# Patient Record
Sex: Male | Born: 1950 | Race: White | Hispanic: No | Marital: Married | Smoking: Never smoker
Health system: Southern US, Community
[De-identification: ages and names within clinical notes are randomized; demographics above are authoritative.]

## PROBLEM LIST (undated history)

## (undated) DIAGNOSIS — I10 Essential (primary) hypertension: Secondary | ICD-10-CM

## (undated) DIAGNOSIS — I639 Cerebral infarction, unspecified: Secondary | ICD-10-CM

## (undated) DIAGNOSIS — E109 Type 1 diabetes mellitus without complications: Secondary | ICD-10-CM

## (undated) DIAGNOSIS — E785 Hyperlipidemia, unspecified: Secondary | ICD-10-CM

## (undated) DIAGNOSIS — G7000 Myasthenia gravis without (acute) exacerbation: Secondary | ICD-10-CM

## (undated) HISTORY — DX: Essential (primary) hypertension: I10

## (undated) HISTORY — DX: Myasthenia gravis without (acute) exacerbation: G70.00

## (undated) HISTORY — DX: Hyperlipidemia, unspecified: E78.5

## (undated) HISTORY — PX: BACK SURGERY: SHX140

## (undated) HISTORY — DX: Type 1 diabetes mellitus without complications: E10.9

---

## 2005-02-17 ENCOUNTER — Ambulatory Visit: Payer: Self-pay | Admitting: Cardiology

## 2016-07-06 LAB — LIPID PANEL
CHOLESTEROL: 146 (ref 0–200)
HDL: 46 (ref 35–70)
LDL Cholesterol: 82
TRIGLYCERIDES: 88 (ref 40–160)

## 2016-07-06 LAB — HEMOGLOBIN A1C: HEMOGLOBIN A1C: 6.3

## 2016-08-18 ENCOUNTER — Encounter: Payer: Self-pay | Admitting: "Endocrinology

## 2016-08-18 ENCOUNTER — Ambulatory Visit (INDEPENDENT_AMBULATORY_CARE_PROVIDER_SITE_OTHER): Payer: Managed Care, Other (non HMO) | Admitting: "Endocrinology

## 2016-08-18 VITALS — BP 135/69 | HR 77 | Ht 72.0 in | Wt 206.0 lb

## 2016-08-18 DIAGNOSIS — E108 Type 1 diabetes mellitus with unspecified complications: Secondary | ICD-10-CM | POA: Diagnosis not present

## 2016-08-18 DIAGNOSIS — E782 Mixed hyperlipidemia: Secondary | ICD-10-CM | POA: Diagnosis not present

## 2016-08-18 DIAGNOSIS — E119 Type 2 diabetes mellitus without complications: Secondary | ICD-10-CM | POA: Insufficient documentation

## 2016-08-18 DIAGNOSIS — IMO0002 Reserved for concepts with insufficient information to code with codable children: Secondary | ICD-10-CM

## 2016-08-18 DIAGNOSIS — I10 Essential (primary) hypertension: Secondary | ICD-10-CM

## 2016-08-18 DIAGNOSIS — E1065 Type 1 diabetes mellitus with hyperglycemia: Secondary | ICD-10-CM | POA: Diagnosis not present

## 2016-08-18 MED ORDER — INSULIN ASPART 100 UNIT/ML FLEXPEN: 8.0000 [IU] | PEN_INJECTOR | Freq: Three times a day (TID) | SUBCUTANEOUS | 2 refills | Status: DC

## 2016-08-18 MED ORDER — INSULIN DEGLUDEC 100 UNIT/ML ~~LOC~~ SOPN: 25.0000 [IU] | PEN_INJECTOR | Freq: Every day | SUBCUTANEOUS | 2 refills | Status: DC

## 2016-08-18 MED ORDER — INSULIN PEN NEEDLE 31G X 8 MM MISC: 1.0000 | 3 refills | Status: AC

## 2016-08-18 NOTE — Progress Notes (Signed)
Subjective:    Patient ID: William Beck, male    DOB: 03-Feb-1951. Patient is being seen in consultation for management of diabetes requested by  Deloria Lair., MD  Past Medical History:  Diagnosis Date  . Diabetes mellitus type I ([place])   . Hyperlipidemia   . Hypertension    Past Surgical History:  Procedure Laterality Date  . BACK SURGERY     Social History   Social History  . Marital status: Widowed    Spouse name: N/A  . Number of children: N/A  . Years of education: N/A   Social History Main Topics  . Smoking status: Never Smoker  . Smokeless tobacco: Never Used  . Alcohol use No  . Drug use: No  . Sexual activity: Not Asked   Other Topics Concern  . None   Social History Narrative  . None   Outpatient Encounter Prescriptions as of 08/18/2016  Medication Sig  . aspirin EC 81 MG tablet Take 81 mg by mouth daily.  [name] lisinopril-hydrochlorothiazide (PRINZIDE,ZESTORETIC) 20-25 MG tablet Take 1 tablet by mouth daily.  . metoprolol succinate (TOPROL-XL) 25 MG 24 hr tablet Take 25 mg by mouth daily.  . simvastatin (ZOCOR) 40 MG tablet Take 40 mg by mouth daily.  . [DISCONTINUED] insulin NPH Human (HUMULIN N,NOVOLIN N) 100 UNIT/ML injection Inject into the skin. 24 units qam & 10 units qhs  . [DISCONTINUED] insulin regular (NOVOLIN R,HUMULIN R) 100 units/mL injection Inject into the skin 3 (three) times daily before meals. 17 units qam & 17 units qhs  . insulin aspart (NOVOLOG FLEXPEN) 100 UNIT/ML FlexPen Inject 8-14 Units into the skin 3 (three) times daily with meals.  . insulin degludec (TRESIBA FLEXTOUCH) 100 UNIT/ML SOPN FlexTouch Pen Inject 0.25 mLs (25 Units total) into the skin daily at 10 pm.  . Insulin Pen Needle (B-D ULTRAFINE III SHORT PEN) 31G X 8 MM MISC 1 each by Does not apply route as directed.   No facility-administered encounter medications on file as of 08/18/2016.    ALLERGIES: No Known Allergies VACCINATION STATUS:  There is no  immunization history on file for this patient.  Diabetes  He presents for his initial diabetic visit. He has type 1 diabetes mellitus. Onset time: He was diagnosed at approximate age of 5 years. His disease course has been fluctuating. Hypoglycemia symptoms include confusion, headaches, hunger and sweats. Pertinent negatives for hypoglycemia include no pallor or seizures. There are no diabetic associated symptoms. Pertinent negatives for diabetes include no chest pain, no fatigue, no polydipsia, no polyphagia, no polyuria and no weakness. There are no hypoglycemic complications. Symptoms are worsening. There are no diabetic complications. Risk factors for coronary artery disease include diabetes mellitus, dyslipidemia, hypertension, male sex and sedentary lifestyle. Current diabetic treatment includes insulin injections (He is currently on Humulin and 24 units a.m. and 10 units p.m., Humulin R 17 units a.m. and 17 units p.m.). He is compliant with treatment most of the time. His weight is increasing steadily. He is following a generally unhealthy diet. When asked about meal planning, he reported none. He has not had a previous visit with a dietitian. He participates in exercise intermittently. His home blood glucose trend is fluctuating dramatically. (He brought her log book showing fluctuating blood glucose readings from 50s to 200s in the same day. His most recent A1c 6.3% from March 2018.) An ACE inhibitor/angiotensin II receptor blocker is being taken. He sees a podiatrist.Eye exam is current.  Hyperlipidemia  This is a chronic problem. The current episode started more than 1 year ago. The problem is controlled. Exacerbating diseases include diabetes. Pertinent negatives include no chest pain, myalgias or shortness of breath. Current antihyperlipidemic treatment includes statins. Risk factors for coronary artery disease include diabetes mellitus, dyslipidemia and hypertension.  Hypertension  This is a  chronic problem. The current episode started more than 1 year ago. The problem is controlled. Associated symptoms include headaches and sweats. Pertinent negatives include no chest pain, neck pain, palpitations or shortness of breath. Risk factors for coronary artery disease include dyslipidemia, diabetes mellitus and male gender. Past treatments include ACE inhibitors.       Review of Systems  Constitutional: Negative for chills, fatigue, fever and unexpected weight change.  HENT: Negative for dental problem, mouth sores and trouble swallowing.   Eyes: Negative for visual disturbance.  Respiratory: Negative for cough, choking, chest tightness, shortness of breath and wheezing.   Cardiovascular: Negative for chest pain, palpitations and leg swelling.  Gastrointestinal: Negative for abdominal distention, abdominal pain, constipation, diarrhea, nausea and vomiting.  Endocrine: Negative for polydipsia, polyphagia and polyuria.  Genitourinary: Negative for dysuria, flank pain, hematuria and urgency.  Musculoskeletal: Negative for back pain, gait problem, myalgias and neck pain.  Skin: Negative for pallor, rash and wound.  Neurological: Positive for headaches. Negative for seizures, syncope, weakness and numbness.  Psychiatric/Behavioral: Positive for confusion. Negative for dysphoric mood.    Objective:    BP 135/69   Pulse 77   Ht 6' (1.829 m)   Wt 206 lb (93.4 kg)   BMI 27.94 kg/m   Wt Readings from Last 3 Encounters:  08/18/16 206 lb (93.4 kg)    Physical Exam  Constitutional: He is oriented to person, place, and time. He appears well-developed and well-nourished. He is cooperative. No distress.  HENT:  Head: Normocephalic and atraumatic.  Eyes: EOM are normal.  Neck: Normal range of motion. Neck supple. No tracheal deviation present. No thyromegaly present.  Cardiovascular: Normal rate, S1 normal, S2 normal and normal heart sounds.  Exam reveals no gallop.   No murmur  heard. Pulses:      Dorsalis pedis pulses are 1+ on the right side, and 1+ on the left side.       Posterior tibial pulses are 1+ on the right side, and 1+ on the left side.  Pulmonary/Chest: Breath sounds normal. No respiratory distress. He has no wheezes.  Abdominal: Soft. Bowel sounds are normal. He exhibits no distension. There is no tenderness. There is no guarding and no CVA tenderness.  Musculoskeletal: He exhibits no edema.       Right shoulder: He exhibits no swelling and no deformity.  Neurological: He is alert and oriented to person, place, and time. He has normal strength and normal reflexes. No cranial nerve deficit or sensory deficit. Gait normal.  Skin: Skin is warm and dry. No rash noted. No cyanosis. Nails show no clubbing.  Psychiatric: He has a normal mood and affect. His speech is normal and behavior is normal. Judgment and thought content normal. Cognition and memory are normal.     Recent Results (from the past 2160 hour(s))  Lipid panel     Status: None   Collection Time: 07/06/16 12:00 AM  Result Value Ref Range   Triglycerides 88 40 - 160   Cholesterol 146 0 - 200   HDL 46 35 - 70   LDL Cholesterol 82   Hemoglobin A1c     Status: None  Collection Time: 07/06/16 12:00 AM  Result Value Ref Range   Hemoglobin A1C 6.3        Assessment & Plan:   1. Uncontrolled type 1 diabetes mellitus with complication ([place])  - Patient has currently uncontrolled symptomatic type 1 DM since  66 years of age,  with most recent A1c of 6.3 %.  - Patient came with her log book showing significant fluctuating blood glucose readings including  Recent labs reviewed.   Patient denies any gross complications, however he remains at a high risk for more acute and chronic complications which include CAD, CVA, CKD, retinopathy, and neuropathy. These are all discussed in detail with the patient.  - I have counseled the patient on diet management and weight loss, by adopting a  carbohydrate restricted/protein rich diet.  - Suggestion is made for patient to avoid simple carbohydrates   from his diet including Cakes , Desserts, Ice Cream,  Soda (  diet and regular) , Sweet Tea , Candies,  Chips, Cookies, Artificial Sweeteners,   and "Sugar-free" Products . This will help patient to have stable blood glucose profile and potentially avoid unintended weight gain.  - I encouraged the patient to switch to  unprocessed or minimally processed complex starch and increased protein intake (animal or plant source), fruits, and vegetables.  - Patient is advised to stick to a routine mealtimes to eat 3 meals  a day and avoid unnecessary snacks ( to snack only to correct hypoglycemia).  - The patient will be scheduled with Jearld Fenton, RDN, CDE for individualized DM education.  - I have approached patient with the following individualized plan to manage diabetes and patient agrees:   -  Given his unpredictable fluctuation in his blood glucose profile and type 1 diabetes, he would benefit from most recent insulin analogs . - I approached him with the idea of switching and he agrees. - His wife offers to help.- I will initiate Tresiba 25 units daily at bedtime as a basal insulin , initiate NovoLog 8 units 3 times a day before meals for  pre-meal BG readings of 90-150mg /dl, plus patient specific correction dose for unexpected hyperglycemia above 150mg /dl, associated with strict monitoring of glucose 4 times a day-before meals and at bedtime. - Patient is warned not to take insulin without proper monitoring per orders. -Adjustment parameters are given for hypo and hyperglycemia in writing. -Patient is encouraged to call clinic for blood glucose levels less than 70 or above 200 mg /dl. - He will discontinue NPH and regular insulin, I gave him samples of Tresiba and Novolog to start. - Insulin is his exclusive choice of therapy. - He would benefit from continued glucose monitoring, I  discussed and gave him brochure for [facility].  - Patient specific target  A1c;  LDL, HDL, Triglycerides, and  Waist Circumference were discussed in detail.  2) BP/HTN: Controlled. Continue current medications including ACEI/ARB. 3) Lipids/HPL:   Controlled with LDL at 82.   Patient is advised to continue statins. 4)  Weight/Diet: CDE Consult will be initiated , exercise, and detailed carbohydrates information provided.  5) Chronic Care/Health Maintenance:  -Patient is on ACEI/ARB and Statin medications and encouraged to continue to follow up with Ophthalmology, Podiatrist at least yearly or according to recommendations, and advised to   stay away from smoking. I have recommended yearly flu vaccine and pneumonia vaccination at least every 5 years; moderate intensity exercise for up to 150 minutes weekly; and  sleep for at least 7 hours  a day.  - 60 minutes of time was spent on the care of this patient , 50% of which was applied for counseling on diabetes complications and their preventions.  - Patient to bring meter and  blood glucose logs during his next visit.   - I advised patient to maintain close follow up with Deloria Lair., MD for primary care needs.  Follow up plan: - Return in about 3 weeks (around 09/08/2016) for follow up with meter and logs- no labs.  Glade Lloyd, MD Phone: [phone]  Fax: [phone]   08/18/2016, 3:45 PM

## 2016-08-18 NOTE — Patient Instructions (Signed)

## 2016-08-19 ENCOUNTER — Other Ambulatory Visit: Payer: Self-pay

## 2016-08-19 MED ORDER — INSULIN LISPRO 100 UNIT/ML (KWIKPEN): 8.0000 [IU] | PEN_INJECTOR | Freq: Three times a day (TID) | SUBCUTANEOUS | 2 refills | Status: DC

## 2016-09-13 ENCOUNTER — Encounter: Payer: Self-pay | Admitting: "Endocrinology

## 2016-09-13 ENCOUNTER — Ambulatory Visit (INDEPENDENT_AMBULATORY_CARE_PROVIDER_SITE_OTHER): Payer: Managed Care, Other (non HMO) | Admitting: "Endocrinology

## 2016-09-13 VITALS — BP 133/75 | HR 71 | Ht 72.0 in | Wt 201.0 lb

## 2016-09-13 DIAGNOSIS — E1065 Type 1 diabetes mellitus with hyperglycemia: Secondary | ICD-10-CM

## 2016-09-13 DIAGNOSIS — E108 Type 1 diabetes mellitus with unspecified complications: Secondary | ICD-10-CM | POA: Diagnosis not present

## 2016-09-13 DIAGNOSIS — E782 Mixed hyperlipidemia: Secondary | ICD-10-CM

## 2016-09-13 DIAGNOSIS — I10 Essential (primary) hypertension: Secondary | ICD-10-CM | POA: Diagnosis not present

## 2016-09-13 DIAGNOSIS — IMO0002 Reserved for concepts with insufficient information to code with codable children: Secondary | ICD-10-CM

## 2016-09-13 MED ORDER — INSULIN DEGLUDEC 100 UNIT/ML ~~LOC~~ SOPN: 25.0000 [IU] | PEN_INJECTOR | Freq: Every day | SUBCUTANEOUS | 2 refills | Status: DC

## 2016-09-13 MED ORDER — INSULIN ASPART 100 UNIT/ML FLEXPEN: 5.0000 [IU] | PEN_INJECTOR | Freq: Three times a day (TID) | SUBCUTANEOUS | 2 refills | Status: DC

## 2016-09-13 NOTE — Patient Instructions (Signed)

## 2016-09-13 NOTE — Progress Notes (Signed)
Subjective:    Patient ID: William Beck, male    DOB: [birth year]. Patient is being seen in consultation for management of diabetes requested by  Deloria Lair., MD  Past Medical History:  Diagnosis Date  . Diabetes mellitus type I ([place])   . Hyperlipidemia   . Hypertension    Past Surgical History:  Procedure Laterality Date  . BACK SURGERY     Social History   Social History  . Marital status: Widowed    Spouse name: N/A  . Number of children: N/A  . Years of education: N/A   Social History Main Topics  . Smoking status: Never Smoker  . Smokeless tobacco: Never Used  . Alcohol use No  . Drug use: No  . Sexual activity: Not Asked   Other Topics Concern  . None   Social History Narrative  . None   Outpatient Encounter Prescriptions as of 09/13/2016  Medication Sig  . aspirin EC 81 MG tablet Take 81 mg by mouth daily.  . insulin aspart (NOVOLOG FLEXPEN) 100 UNIT/ML FlexPen Inject 5-11 Units into the skin 3 (three) times daily with meals.  . insulin degludec (TRESIBA FLEXTOUCH) 100 UNIT/ML SOPN FlexTouch Pen Inject 0.25 mLs (25 Units total) into the skin daily at 10 pm.  . Insulin Pen Needle (B-D ULTRAFINE III SHORT PEN) 31G X 8 MM MISC 1 each by Does not apply route as directed.  [name] lisinopril-hydrochlorothiazide (PRINZIDE,ZESTORETIC) 20-25 MG tablet Take 1 tablet by mouth daily.  . metoprolol succinate (TOPROL-XL) 25 MG 24 hr tablet Take 25 mg by mouth daily.  . simvastatin (ZOCOR) 40 MG tablet Take 40 mg by mouth daily.  . [DISCONTINUED] insulin aspart (NOVOLOG FLEXPEN) 100 UNIT/ML FlexPen Inject 8-14 Units into the skin 3 (three) times daily with meals.  . [DISCONTINUED] insulin degludec (TRESIBA FLEXTOUCH) 100 UNIT/ML SOPN FlexTouch Pen Inject 0.25 mLs (25 Units total) into the skin daily at 10 pm.  . [DISCONTINUED] insulin lispro (HUMALOG KWIKPEN) 100 UNIT/ML KiwkPen Inject 0.08-0.14 mLs (8-14 Units total) into the skin 3 (three) times daily.   No  facility-administered encounter medications on file as of 09/13/2016.    ALLERGIES: No Known Allergies VACCINATION STATUS:  There is no immunization history on file for this patient.  Diabetes  He presents for his follow-up diabetic visit. He has type 1 diabetes mellitus. Onset time: He was diagnosed at approximate age of 89 years. His disease course has been improving. There are no hypoglycemic associated symptoms. Pertinent negatives for hypoglycemia include no confusion, headaches, hunger, pallor, seizures or sweats. There are no diabetic associated symptoms. Pertinent negatives for diabetes include no chest pain, no fatigue, no polydipsia, no polyphagia, no polyuria and no weakness. There are no hypoglycemic complications. Symptoms are improving. There are no diabetic complications. Risk factors for coronary artery disease include diabetes mellitus, dyslipidemia, hypertension, male sex and sedentary lifestyle. Current diabetic treatment includes insulin injections (He is currently on Humulin and 24 units a.m. and 10 units p.m., Humulin R 17 units a.m. and 17 units p.m.). He is compliant with treatment most of the time. His weight is decreasing steadily. He is following a generally unhealthy diet. When asked about meal planning, he reported none. He has not had a previous visit with a dietitian. He participates in exercise intermittently. His home blood glucose trend is fluctuating dramatically. His breakfast blood glucose range is generally 130-140 mg/dl. His lunch blood glucose range is generally 130-140 mg/dl. His dinner blood glucose range is  generally 130-140 mg/dl. His bedtime blood glucose range is generally 130-140 mg/dl. His overall blood glucose range is 130-140 mg/dl. (He brought her log book showing fluctuating blood glucose readings from 50s to 200s in the same day. His most recent A1c 6.3% from March 2018.) An ACE inhibitor/angiotensin II receptor blocker is being taken. He sees a  podiatrist.Eye exam is current.  Hyperlipidemia  This is a chronic problem. The current episode started more than 1 year ago. The problem is controlled. Exacerbating diseases include diabetes. Pertinent negatives include no chest pain, myalgias or shortness of breath. Current antihyperlipidemic treatment includes statins. Risk factors for coronary artery disease include diabetes mellitus, dyslipidemia and hypertension.  Hypertension  This is a chronic problem. The current episode started more than 1 year ago. The problem is controlled. Pertinent negatives include no chest pain, headaches, neck pain, palpitations, shortness of breath or sweats. Risk factors for coronary artery disease include dyslipidemia, diabetes mellitus and male gender. Past treatments include ACE inhibitors.       Review of Systems  Constitutional: Negative for chills, fatigue, fever and unexpected weight change.  HENT: Negative for dental problem, mouth sores and trouble swallowing.   Eyes: Negative for visual disturbance.  Respiratory: Negative for cough, choking, chest tightness, shortness of breath and wheezing.   Cardiovascular: Negative for chest pain, palpitations and leg swelling.  Gastrointestinal: Negative for abdominal distention, abdominal pain, constipation, diarrhea, nausea and vomiting.  Endocrine: Negative for polydipsia, polyphagia and polyuria.  Genitourinary: Negative for dysuria, flank pain, hematuria and urgency.  Musculoskeletal: Negative for back pain, gait problem, myalgias and neck pain.  Skin: Negative for pallor, rash and wound.  Neurological: Negative for seizures, syncope, weakness, numbness and headaches.  Psychiatric/Behavioral: Negative for confusion and dysphoric mood.    Objective:    BP 133/75   Pulse 71   Ht 6' (1.829 m)   Wt 201 lb (91.2 kg)   BMI 27.26 kg/m   Wt Readings from Last 3 Encounters:  09/13/16 201 lb (91.2 kg)  08/18/16 206 lb (93.4 kg)    Physical Exam   Constitutional: He is oriented to person, place, and time. He appears well-developed and well-nourished. He is cooperative. No distress.  HENT:  Head: Normocephalic and atraumatic.  Eyes: EOM are normal.  Neck: Normal range of motion. Neck supple. No tracheal deviation present. No thyromegaly present.  Cardiovascular: Normal rate, S1 normal, S2 normal and normal heart sounds.  Exam reveals no gallop.   No murmur heard. Pulses:      Dorsalis pedis pulses are 1+ on the right side, and 1+ on the left side.       Posterior tibial pulses are 1+ on the right side, and 1+ on the left side.  Pulmonary/Chest: Breath sounds normal. No respiratory distress. He has no wheezes.  Abdominal: Soft. Bowel sounds are normal. He exhibits no distension. There is no tenderness. There is no guarding and no CVA tenderness.  Musculoskeletal: He exhibits no edema.       Right shoulder: He exhibits no swelling and no deformity.  Neurological: He is alert and oriented to person, place, and time. He has normal strength and normal reflexes. No cranial nerve deficit or sensory deficit. Gait normal.  Skin: Skin is warm and dry. No rash noted. No cyanosis. Nails show no clubbing.  Psychiatric: He has a normal mood and affect. His speech is normal and behavior is normal. Judgment and thought content normal. Cognition and memory are normal.  Recent Results (from the past 2160 hour(s))  Lipid panel     Status: None   Collection Time: 07/06/16 12:00 AM  Result Value Ref Range   Triglycerides 88 40 - 160   Cholesterol 146 0 - 200   HDL 46 35 - 70   LDL Cholesterol 82   Hemoglobin A1c     Status: None   Collection Time: 07/06/16 12:00 AM  Result Value Ref Range   Hemoglobin A1C 6.3        Assessment & Plan:   1. Uncontrolled type 1 diabetes mellitus with complication ([place])  - Patient has currently uncontrolled symptomatic type 1 DM since  66 years of age,  with most recent A1c of 6.3 %.  - Patient came with  her log book showing significant fluctuating blood glucose readings including  Recent labs reviewed.   Patient denies any gross complications, however he remains at a high risk for more acute and chronic complications which include CAD, CVA, CKD, retinopathy, and neuropathy. These are all discussed in detail with the patient.  - I have counseled the patient on diet management and weight loss, by adopting a carbohydrate restricted/protein rich diet.  - Suggestion is made for patient to avoid simple carbohydrates   from his diet including Cakes , Desserts, Ice Cream,  Soda (  diet and regular) , Sweet Tea , Candies,  Chips, Cookies, Artificial Sweeteners,   and "Sugar-free" Products . This will help patient to have stable blood glucose profile and potentially avoid unintended weight gain.  - I encouraged the patient to switch to  unprocessed or minimally processed complex starch and increased protein intake (animal or plant source), fruits, and vegetables.  - Patient is advised to stick to a routine mealtimes to eat 3 meals  a day and avoid unnecessary snacks ( to snack only to correct hypoglycemia).  - The patient will be scheduled with Jearld Fenton, RDN, CDE for individualized DM education.  - I have approached patient with the following individualized plan to manage diabetes and patient agrees:   -   He came with much better blood glucose profile on insulin analogs.  - I will continue Tresiba 25 units daily at bedtime as a basal insulin ,  lower NovoLog to 5 units 3 times a day before meals for  pre-meal BG readings of 90-150mg /dl, plus patient specific correction dose for unexpected hyperglycemia above 150mg /dl, associated with strict monitoring of glucose 4 times a day-before meals and at bedtime. - Patient is warned not to take insulin without proper monitoring per orders. -Adjustment parameters are given for hypo and hyperglycemia in writing. -Patient is encouraged to call clinic for blood  glucose levels less than 70 or above 200 mg /dl.  - Insulin is his exclusive choice of therapy. - He would benefit from continued glucose monitoring, I discussed and gave him brochure for [facility]. - His next labs would include anti- gluthamic acid decarboxylase and Islet cell antibodies to classify his diabetes properly.  - Patient specific target  A1c;  LDL, HDL, Triglycerides, and  Waist Circumference were discussed in detail.  2) BP/HTN: Controlled. Continue current medications including ACEI/ARB. 3) Lipids/HPL:   Controlled with LDL at 82.   Patient is advised to continue statins. 4)  Weight/Diet: CDE Consult has been initiated , exercise, and detailed carbohydrates information provided.  5) Chronic Care/Health Maintenance:  -Patient is on ACEI/ARB and Statin medications and encouraged to continue to follow up with Ophthalmology, Podiatrist at least yearly  or according to recommendations, and advised to   stay away from smoking. I have recommended yearly flu vaccine and pneumonia vaccination at least every 5 years; moderate intensity exercise for up to 150 minutes weekly; and  sleep for at least 7 hours a day.  - 60 minutes of time was spent on the care of this patient , 50% of which was applied for counseling on diabetes complications and their preventions.  - Patient to bring meter and  blood glucose logs during his next visit.   - I advised patient to maintain close follow up with Deloria Lair., MD for primary care needs.  Follow up plan: - Return in about 6 weeks (around 10/25/2016) for meter, and logs.  Glade Lloyd, MD Phone: [phone]  Fax: [phone]   09/13/2016, 4:32 PM

## 2016-09-30 ENCOUNTER — Ambulatory Visit: Payer: Self-pay | Admitting: Nutrition

## 2016-11-01 ENCOUNTER — Ambulatory Visit: Payer: Managed Care, Other (non HMO) | Admitting: "Endocrinology

## 2016-11-01 ENCOUNTER — Ambulatory Visit: Payer: Self-pay | Admitting: Nutrition

## 2016-11-25 LAB — LIPID PANEL
Cholesterol: 132 (ref 0–200)
HDL: 42 (ref 35–70)
LDL Cholesterol: 75
TRIGLYCERIDES: 75 (ref 40–160)

## 2016-11-25 LAB — TSH: TSH: 3.28 (ref <=5.90)

## 2016-11-25 LAB — HEMOGLOBIN A1C: Hemoglobin A1C: 6.5

## 2016-12-01 ENCOUNTER — Encounter: Payer: Self-pay | Admitting: "Endocrinology

## 2016-12-01 ENCOUNTER — Ambulatory Visit (INDEPENDENT_AMBULATORY_CARE_PROVIDER_SITE_OTHER): Payer: Medicare Other | Admitting: "Endocrinology

## 2016-12-01 VITALS — BP 121/70 | HR 80 | Ht 72.0 in | Wt 192.0 lb

## 2016-12-01 DIAGNOSIS — E1065 Type 1 diabetes mellitus with hyperglycemia: Secondary | ICD-10-CM | POA: Diagnosis not present

## 2016-12-01 DIAGNOSIS — E782 Mixed hyperlipidemia: Secondary | ICD-10-CM | POA: Diagnosis not present

## 2016-12-01 DIAGNOSIS — I10 Essential (primary) hypertension: Secondary | ICD-10-CM | POA: Diagnosis not present

## 2016-12-01 DIAGNOSIS — IMO0002 Reserved for concepts with insufficient information to code with codable children: Secondary | ICD-10-CM

## 2016-12-01 DIAGNOSIS — E108 Type 1 diabetes mellitus with unspecified complications: Secondary | ICD-10-CM | POA: Diagnosis not present

## 2016-12-01 MED ORDER — INSULIN DEGLUDEC 100 UNIT/ML ~~LOC~~ SOPN: 30.0000 [IU] | PEN_INJECTOR | Freq: Every day | SUBCUTANEOUS | 2 refills | Status: DC

## 2016-12-01 NOTE — Progress Notes (Signed)
Subjective:    Patient ID: William Beck, male    DOB: 09-03-1950. Patient is being seen in consultation for management of diabetes requested by  Deloria Lair., MD  Past Medical History:  Diagnosis Date  . Diabetes mellitus type I ([place])   . Hyperlipidemia   . Hypertension    Past Surgical History:  Procedure Laterality Date  . BACK SURGERY     Social History   Social History  . Marital status: Widowed    Spouse name: N/A  . Number of children: N/A  . Years of education: N/A   Social History Main Topics  . Smoking status: Never Smoker  . Smokeless tobacco: Never Used  . Alcohol use No  . Drug use: No  . Sexual activity: Not Asked   Other Topics Concern  . None   Social History Narrative  . None   Outpatient Encounter Prescriptions as of 12/01/2016  Medication Sig  . aspirin EC 81 MG tablet Take 81 mg by mouth daily.  . insulin degludec (TRESIBA FLEXTOUCH) 100 UNIT/ML SOPN FlexTouch Pen Inject 0.3 mLs (30 Units total) into the skin daily at 10 pm.  . Insulin Pen Needle (B-D ULTRAFINE III SHORT PEN) 31G X 8 MM MISC 1 each by Does not apply route as directed.  [name] lisinopril-hydrochlorothiazide (PRINZIDE,ZESTORETIC) 20-25 MG tablet Take 1 tablet by mouth daily.  . metoprolol succinate (TOPROL-XL) 25 MG 24 hr tablet Take 25 mg by mouth daily.  . simvastatin (ZOCOR) 40 MG tablet Take 40 mg by mouth daily.  . [DISCONTINUED] insulin aspart (NOVOLOG FLEXPEN) 100 UNIT/ML FlexPen Inject 5-11 Units into the skin 3 (three) times daily with meals.  . [DISCONTINUED] insulin degludec (TRESIBA FLEXTOUCH) 100 UNIT/ML SOPN FlexTouch Pen Inject 0.25 mLs (25 Units total) into the skin daily at 10 pm.   No facility-administered encounter medications on file as of 12/01/2016.    ALLERGIES: No Known Allergies VACCINATION STATUS:  There is no immunization history on file for this patient.  Diabetes  He presents for his follow-up diabetic visit. Onset time: He was diagnosed at  approximate age of 66 years. His disease course has been stable. There are no hypoglycemic associated symptoms. Pertinent negatives for hypoglycemia include no confusion, headaches, hunger, pallor, seizures or sweats. There are no diabetic associated symptoms. Pertinent negatives for diabetes include no chest pain, no fatigue, no polydipsia, no polyphagia, no polyuria and no weakness. There are no hypoglycemic complications. Symptoms are stable. There are no diabetic complications. Risk factors for coronary artery disease include diabetes mellitus, dyslipidemia, hypertension, male sex and sedentary lifestyle. Current diabetic treatment includes insulin injections (He is currently on Humulin and 24 units a.m. and 10 units p.m., Humulin R 17 units a.m. and 17 units p.m.). He is compliant with treatment most of the time. His weight is decreasing steadily (He lost 14 pounds in the last 5 months.). He is following a generally unhealthy diet. When asked about meal planning, he reported none. He has not had a previous visit with a dietitian. He participates in exercise intermittently. His home blood glucose trend is fluctuating dramatically. His breakfast blood glucose range is generally 110-130 mg/dl. His lunch blood glucose range is generally 110-130 mg/dl. His dinner blood glucose range is generally 110-130 mg/dl. His bedtime blood glucose range is generally 110-130 mg/dl. His overall blood glucose range is 110-130 mg/dl. (He brought her log book showing fluctuating blood glucose readings from 50s to 200s in the same day. His most recent  A1c 6.3% from March 2018.) An ACE inhibitor/angiotensin II receptor blocker is being taken. He sees a podiatrist.Eye exam is current.  Hyperlipidemia  This is a chronic problem. The current episode started more than 1 year ago. The problem is controlled. Exacerbating diseases include diabetes. Pertinent negatives include no chest pain, myalgias or shortness of breath. Current  antihyperlipidemic treatment includes statins. Risk factors for coronary artery disease include diabetes mellitus, dyslipidemia and hypertension.  Hypertension  This is a chronic problem. The current episode started more than 1 year ago. The problem is controlled. Pertinent negatives include no chest pain, headaches, neck pain, palpitations, shortness of breath or sweats. Risk factors for coronary artery disease include dyslipidemia, diabetes mellitus and male gender. Past treatments include ACE inhibitors.     Review of Systems  Constitutional: Negative for chills, fatigue, fever and unexpected weight change.  HENT: Negative for dental problem, mouth sores and trouble swallowing.   Eyes: Negative for visual disturbance.  Respiratory: Negative for cough, choking, chest tightness, shortness of breath and wheezing.   Cardiovascular: Negative for chest pain, palpitations and leg swelling.  Gastrointestinal: Negative for abdominal distention, abdominal pain, constipation, diarrhea, nausea and vomiting.  Endocrine: Negative for polydipsia, polyphagia and polyuria.  Genitourinary: Negative for dysuria, flank pain, hematuria and urgency.  Musculoskeletal: Negative for back pain, gait problem, myalgias and neck pain.  Skin: Negative for pallor, rash and wound.  Neurological: Negative for seizures, syncope, weakness, numbness and headaches.  Psychiatric/Behavioral: Negative for confusion and dysphoric mood.    Objective:    BP 121/70   Pulse 80   Ht 6' (1.829 m)   Wt 192 lb (87.1 kg)   BMI 26.04 kg/m   Wt Readings from Last 3 Encounters:  12/01/16 192 lb (87.1 kg)  09/13/16 201 lb (91.2 kg)  08/18/16 206 lb (93.4 kg)    Physical Exam  Constitutional: He is oriented to person, place, and time. He appears well-developed and well-nourished. He is cooperative. No distress.  HENT:  Head: Normocephalic and atraumatic.  Eyes: EOM are normal.  Neck: Normal range of motion. Neck supple. No  tracheal deviation present. No thyromegaly present.  Cardiovascular: Normal rate, S1 normal, S2 normal and normal heart sounds.  Exam reveals no gallop.   No murmur heard. Pulses:      Dorsalis pedis pulses are 1+ on the right side, and 1+ on the left side.       Posterior tibial pulses are 1+ on the right side, and 1+ on the left side.  Pulmonary/Chest: Breath sounds normal. No respiratory distress. He has no wheezes.  Abdominal: Soft. Bowel sounds are normal. He exhibits no distension. There is no tenderness. There is no guarding and no CVA tenderness.  Musculoskeletal: He exhibits no edema.       Right shoulder: He exhibits no swelling and no deformity.  Neurological: He is alert and oriented to person, place, and time. He has normal strength and normal reflexes. No cranial nerve deficit or sensory deficit. Gait normal.  Skin: Skin is warm and dry. No rash noted. No cyanosis. Nails show no clubbing.  Psychiatric: He has a normal mood and affect. His speech is normal and behavior is normal. Judgment and thought content normal. Cognition and memory are normal.     Recent Results (from the past 2160 hour(s))  Lipid panel     Status: None   Collection Time: 11/25/16 12:00 AM  Result Value Ref Range   Triglycerides 75 40 - 160  Cholesterol 132 0 - 200   HDL 42 35 - 70   LDL Cholesterol 75   Hemoglobin A1c     Status: None   Collection Time: 11/25/16 12:00 AM  Result Value Ref Range   Hemoglobin A1C 6.5   TSH     Status: None   Collection Time: 11/25/16 12:00 AM  Result Value Ref Range   TSH 3.28 0.41 - 5.90    Comment: ft4 1.34   Anti-islet antibodies from 11/25/2016 where negative.    Assessment & Plan:   1. diabetes mellitus with complication ([place])- Type unspecified  - Patient has currently controlled asymptomatic diabetes diagnosed since  66 years of age,  with most recent A1c of 6.5 %.  - Patient came with his log book showing significant improvement in his blood glucose  profile. 95% of his readings are on target.   Patient denies any gross complications, however he remains at a high risk for more acute and chronic complications which include CAD, CVA, CKD, retinopathy, and neuropathy. These are all discussed in detail with the patient.  - I have counseled the patient on diet management and weight loss, by adopting a carbohydrate restricted/protein rich diet.  -Suggestion is made for him to avoid simple carbohydrates  from his diet including Cakes, Sweet Desserts, Ice Cream, Soda (diet and regular), Sweet Tea, Candies, Chips, Cookies, Store Bought Juices, Alcohol in Excess of  1-2 drinks a day, Artificial Sweeteners, and "Sugar-free" Products. This will help patient to have stable blood glucose profile and potentially avoid unintended weight gain.   - I encouraged the patient to switch to  unprocessed or minimally processed complex starch and increased protein intake (animal or plant source), fruits, and vegetables.  - Patient is advised to stick to a routine mealtimes to eat 3 meals  a day and avoid unnecessary snacks ( to snack only to correct hypoglycemia).   - I have approached patient with the following individualized plan to manage diabetes and patient agrees:   -   He came with continued improvement in his  blood glucose profile on insulin analogs.  He did have a chance to skip a number of NovoLog injection opportunities. - He is anti-islet cell antibodies are negative- opening a possibility of type II diabetes mellitus type 1. - I will increase his  Tresiba to 30 units daily at bedtime as a basal insulin ,  hold his bolus insulin NovoLog for now .  He will continue to perform strict monitoring of glucose at least 2 times a day-daily before breakfast and at bedtime.  - Patient is warned not to take insulin without proper monitoring per orders.  -Patient is encouraged to call clinic for blood glucose levels less than 70 or above 200 mg /dl. - He may have  other choices of therapy based on his next response, and he is anti-GAD antibodies.  - His next labs would include anti- gluthamic acid decarboxylase antibodies to classify his diabetes properly.  - Patient specific target  A1c;  LDL, HDL, Triglycerides, and  Waist Circumference were discussed in detail.  2) BP/HTN: Controlled. Continue current medications including ACEI/ARB. 3) Lipids/HPL:   Controlled with LDL at 75.   Patient is advised to continue statins. 4)  Weight/Diet:   he lost 14 pounds, CDE Consult has been initiated , exercise, and detailed carbohydrates information provided.  5) Chronic Care/Health Maintenance:  -Patient is on ACEI/ARB and Statin medications and encouraged to continue to follow up with Ophthalmology, Podiatrist  at least yearly or according to recommendations, and advised to   stay away from smoking. I have recommended yearly flu vaccine and pneumonia vaccination at least every 5 years; moderate intensity exercise for up to 150 minutes weekly; and  sleep for at least 7 hours a day.  - Time spent with the patient: 25 min, of which >50% was spent in reviewing his sugar logs , discussing his hypo- and hyper-glycemic episodes, reviewing his current and  previous labs and insulin doses and developing a plan to avoid hypo- and hyper-glycemia.   - I advised patient to maintain close follow up with Deloria Lair., MD for primary care needs.  Follow up plan: - Return in about 3 months (around 03/02/2017) for meter, and logs.  Glade Lloyd, MD Phone: [phone]  Fax: [phone]  This note was partially dictated with voice recognition software. Similar sounding words can be transcribed inadequately or may not  be corrected upon review.  12/01/2016, 5:04 PM

## 2016-12-01 NOTE — Patient Instructions (Signed)

## 2016-12-08 ENCOUNTER — Telehealth: Payer: Self-pay | Admitting: "Endocrinology

## 2016-12-08 NOTE — Telephone Encounter (Signed)
[facility] is calling with abnormal BG readings  Sunday 09/30  Am- 69 BEFORE DINNER 229 Pm-[phone]  Monday 10/01  Am 119 BEFORE DINNER 232 Pm 314  Tuesday 10/02  Am 163 BEFORE DINNER [phone] [id number] [id number]  Wednesday 10/03  Am 90  Please Advise of any changes

## 2016-12-08 NOTE — Telephone Encounter (Signed)
Please advise him to  1) lower tresiba to 24 units qhs 2) restart monitoring blood glucose 4 times a day-before meals and at bedtime 3) restart NovoLog 5 units 3 times a day before meals for pre-meal blood glucose above 90 mg/dL plus correction based on the previous orders. 4) call back if readings are above 2003

## 2016-12-09 NOTE — Telephone Encounter (Signed)
Pt.notified

## 2016-12-26 ENCOUNTER — Other Ambulatory Visit: Payer: Self-pay | Admitting: "Endocrinology

## 2017-03-09 ENCOUNTER — Ambulatory Visit: Payer: Medicare Other | Admitting: "Endocrinology

## 2017-03-24 ENCOUNTER — Ambulatory Visit (INDEPENDENT_AMBULATORY_CARE_PROVIDER_SITE_OTHER): Payer: Medicare Other | Admitting: "Endocrinology

## 2017-03-24 ENCOUNTER — Encounter: Payer: Self-pay | Admitting: "Endocrinology

## 2017-03-24 VITALS — BP 125/72 | HR 69 | Ht 72.0 in | Wt 188.0 lb

## 2017-03-24 DIAGNOSIS — I10 Essential (primary) hypertension: Secondary | ICD-10-CM

## 2017-03-24 DIAGNOSIS — E1065 Type 1 diabetes mellitus with hyperglycemia: Secondary | ICD-10-CM | POA: Diagnosis not present

## 2017-03-24 DIAGNOSIS — E108 Type 1 diabetes mellitus with unspecified complications: Secondary | ICD-10-CM

## 2017-03-24 DIAGNOSIS — IMO0002 Reserved for concepts with insufficient information to code with codable children: Secondary | ICD-10-CM

## 2017-03-24 DIAGNOSIS — E782 Mixed hyperlipidemia: Secondary | ICD-10-CM | POA: Diagnosis not present

## 2017-03-24 LAB — BASIC METABOLIC PANEL
BUN: 26 — AB (ref 4–21)
Creatinine: 0.9 (ref <=1.3)

## 2017-03-24 LAB — LIPID PANEL
CHOLESTEROL: 132 (ref 0–200)
HDL: 42 (ref 35–70)
LDL CALC: 75
TRIGLYCERIDES: 75 (ref 40–160)

## 2017-03-24 LAB — TSH: TSH: 3.28 (ref <=5.90)

## 2017-03-24 LAB — HEMOGLOBIN A1C: HEMOGLOBIN A1C: 6.6

## 2017-03-24 MED ORDER — FREESTYLE LIBRE SENSOR SYSTEM MISC: 2 refills | Status: DC

## 2017-03-24 MED ORDER — FREESTYLE LIBRE READER DEVI: 1.0000 | Freq: Once | 0 refills | Status: AC

## 2017-03-24 NOTE — Progress Notes (Signed)
Subjective:    Patient ID: William Beck, male    DOB: [birth year]. Patient is being seen in consultation for management of diabetes requested by  Deloria Lair., MD  Past Medical History:  Diagnosis Date  . Diabetes mellitus type I ([place])   . Hyperlipidemia   . Hypertension    Past Surgical History:  Procedure Laterality Date  . BACK SURGERY     Social History   Socioeconomic History  . Marital status: Widowed    Spouse name: None  . Number of children: None  . Years of education: None  . Highest education level: None  Social Needs  . Financial resource strain: None  . Food insecurity - worry: None  . Food insecurity - inability: None  . Transportation needs - medical: None  . Transportation needs - non-medical: None  Occupational History  . None  Tobacco Use  . Smoking status: Never Smoker  . Smokeless tobacco: Never Used  Substance and Sexual Activity  . Alcohol use: No  . Drug use: No  . Sexual activity: None  Other Topics Concern  . None  Social History Narrative  . None   Outpatient Encounter Medications as of 03/24/2017  Medication Sig  . aspirin EC 81 MG tablet Take 81 mg by mouth daily.  . Continuous Blood Gluc Receiver (FREESTYLE LIBRE READER) DEVI 1 Piece by Does not apply route once for 1 dose.  . Continuous Blood Gluc Sensor (FREESTYLE LIBRE SENSOR SYSTEM) MISC Use one sensor every 10 days.  . insulin degludec (TRESIBA FLEXTOUCH) 100 UNIT/ML SOPN FlexTouch Pen Inject 0.3 mLs (30 Units total) into the skin daily at 10 pm.  . Insulin Pen Needle (B-D ULTRAFINE III SHORT PEN) 31G X 8 MM MISC 1 each by Does not apply route as directed.  [name] lisinopril-hydrochlorothiazide (PRINZIDE,ZESTORETIC) 20-25 MG tablet Take 1 tablet by mouth daily.  . metoprolol succinate (TOPROL-XL) 25 MG 24 hr tablet Take 25 mg by mouth daily.  [name] NOVOLOG FLEXPEN 100 UNIT/ML FlexPen INJECT 5-11 UNITS INTO THE SKIN 3 (THREE) TIMES DAILY WITH MEALS.  . simvastatin (ZOCOR) 40 MG  tablet Take 40 mg by mouth daily.  . TRESIBA FLEXTOUCH 100 UNIT/ML SOPN FlexTouch Pen INJECT 0.25 MLS (25 UNITS TOTAL) INTO THE SKIN DAILY AT 10 PM.   No facility-administered encounter medications on file as of 03/24/2017.    ALLERGIES: No Known Allergies VACCINATION STATUS:  There is no immunization history on file for this patient.  Diabetes  He presents for his follow-up diabetic visit. He has type 1 diabetes mellitus. Onset time: He was diagnosed at approximate age of 63 years. His disease course has been stable. There are no hypoglycemic associated symptoms. Pertinent negatives for hypoglycemia include no confusion, headaches, hunger, pallor, seizures or sweats. There are no diabetic associated symptoms. Pertinent negatives for diabetes include no chest pain, no fatigue, no polydipsia, no polyphagia, no polyuria and no weakness. There are no hypoglycemic complications. Symptoms are stable. There are no diabetic complications. Risk factors for coronary artery disease include diabetes mellitus, dyslipidemia, hypertension, male sex and sedentary lifestyle. Current diabetic treatment includes insulin injections (He is currently on Humulin and 24 units a.m. and 10 units p.m., Humulin R 17 units a.m. and 17 units p.m.). He is compliant with treatment most of the time. His weight is decreasing steadily. He is following a diabetic diet. When asked about meal planning, he reported none. He has not had a previous visit with a dietitian. He participates  in exercise intermittently. His home blood glucose trend is fluctuating dramatically. His breakfast blood glucose range is generally 110-130 mg/dl. His lunch blood glucose range is generally 110-130 mg/dl. His dinner blood glucose range is generally 110-130 mg/dl. His bedtime blood glucose range is generally 110-130 mg/dl. His overall blood glucose range is 110-130 mg/dl. An ACE inhibitor/angiotensin II receptor blocker is being taken. He sees a podiatrist.Eye  exam is current.  Hyperlipidemia  This is a chronic problem. The current episode started more than 1 year ago. The problem is controlled. Exacerbating diseases include diabetes. Pertinent negatives include no chest pain, myalgias or shortness of breath. Current antihyperlipidemic treatment includes statins. Risk factors for coronary artery disease include diabetes mellitus, dyslipidemia and hypertension.  Hypertension  This is a chronic problem. The current episode started more than 1 year ago. The problem is controlled. Pertinent negatives include no chest pain, headaches, neck pain, palpitations, shortness of breath or sweats. Risk factors for coronary artery disease include dyslipidemia, diabetes mellitus and male gender. Past treatments include ACE inhibitors.     Review of Systems  Constitutional: Negative for chills, fatigue, fever and unexpected weight change.  HENT: Negative for dental problem, mouth sores and trouble swallowing.   Eyes: Negative for visual disturbance.  Respiratory: Negative for cough, choking, chest tightness, shortness of breath and wheezing.   Cardiovascular: Negative for chest pain, palpitations and leg swelling.  Gastrointestinal: Negative for abdominal distention, abdominal pain, constipation, diarrhea, nausea and vomiting.  Endocrine: Negative for polydipsia, polyphagia and polyuria.  Genitourinary: Negative for dysuria, flank pain, hematuria and urgency.  Musculoskeletal: Negative for back pain, gait problem, myalgias and neck pain.  Skin: Negative for pallor, rash and wound.  Neurological: Negative for seizures, syncope, weakness, numbness and headaches.  Psychiatric/Behavioral: Negative for confusion and dysphoric mood.    Objective:    BP 125/72   Pulse 69   Ht 6' (1.829 m)   Wt 188 lb (85.3 kg)   BMI 25.50 kg/m   Wt Readings from Last 3 Encounters:  03/24/17 188 lb (85.3 kg)  12/01/16 192 lb (87.1 kg)  09/13/16 201 lb (91.2 kg)    Physical  Exam  Constitutional: He is oriented to person, place, and time. He appears well-developed and well-nourished. He is cooperative. No distress.  HENT:  Head: Normocephalic and atraumatic.  Eyes: EOM are normal.  Neck: Normal range of motion. Neck supple. No tracheal deviation present. No thyromegaly present.  Cardiovascular: Normal rate, S1 normal, S2 normal and normal heart sounds. Exam reveals no gallop.  No murmur heard. Pulses:      Dorsalis pedis pulses are 1+ on the right side, and 1+ on the left side.       Posterior tibial pulses are 1+ on the right side, and 1+ on the left side.  Pulmonary/Chest: Breath sounds normal. No respiratory distress. He has no wheezes.  Abdominal: Soft. Bowel sounds are normal. He exhibits no distension. There is no tenderness. There is no guarding and no CVA tenderness.  Musculoskeletal: He exhibits no edema.       Right shoulder: He exhibits no swelling and no deformity.  Neurological: He is alert and oriented to person, place, and time. He has normal strength and normal reflexes. No cranial nerve deficit or sensory deficit. Gait normal.  Skin: Skin is warm and dry. No rash noted. No cyanosis. Nails show no clubbing.  Psychiatric: He has a normal mood and affect. His speech is normal and behavior is normal. Judgment and thought  content normal. Cognition and memory are normal.   Recent Results (from the past 2160 hour(s))  Basic metabolic panel     Status: Abnormal   Collection Time: 03/24/17 12:00 AM  Result Value Ref Range   BUN 26 (A) 4 - 21   Creatinine 0.9 0.6 - 1.3  Lipid panel     Status: None   Collection Time: 03/24/17 12:00 AM  Result Value Ref Range   Triglycerides 75 40 - 160   Cholesterol 132 0 - 200   HDL 42 35 - 70   LDL Cholesterol 75   Hemoglobin A1c     Status: None   Collection Time: 03/24/17 12:00 AM  Result Value Ref Range   Hemoglobin A1C 6.6   TSH     Status: None   Collection Time: 03/24/17 12:00 AM  Result Value Ref  Range   TSH 3.28 0.41 - 5.90    Comment: free t4 1.34     Anti-islet antibodies from 11/25/2016 where negative.    Assessment & Plan:   1. diabetes mellitus with complication ([place])- Type unspecified  - Patient has currently controlled asymptomatic diabetes diagnosed since  67 years of age. - His most recent labs from an outside facility showing A1c of 6.6%.  - Patient came with his log book showing significant improvement in his blood glucose profile 95% of his readings are on target.    Patient denies any gross complications, however he remains at a high risk for more acute and chronic complications which include CAD, CVA, CKD, retinopathy, and neuropathy. These are all discussed in detail with the patient.  - I have counseled the patient on diet management and weight loss, by adopting a carbohydrate restricted/protein rich diet.  -  Suggestion is made for him to avoid simple carbohydrates  from his diet including Cakes, Sweet Desserts / Pastries, Ice Cream, Soda (diet and regular), Sweet Tea, Candies, Chips, Cookies, Store Bought Juices, Alcohol in Excess of  1-2 drinks a day, Artificial Sweeteners, and "Sugar-free" Products. This will help patient to have stable blood glucose profile and potentially avoid unintended weight gain.   - I encouraged the patient to switch to  unprocessed or minimally processed complex starch and increased protein intake (animal or plant source), fruits, and vegetables.  - Patient is advised to stick to a routine mealtimes to eat 3 meals  a day and avoid unnecessary snacks ( to snack only to correct hypoglycemia).   - I have approached patient with the following individualized plan to manage diabetes and patient agrees:   -   He came with continued improvement in his  blood glucose profile on insulin analogs.  - And attempts to discontinue his prandial insulin did not work. - He'll continue on intensive treatment with basal/bolus insulin. - I'll  continue with Tresiba  24 units units daily at bedtime as a basal insulin ,   NovoLog 5-11 units for pre-meal blood glucose readings above 90 mg/dL, associated with strict monitoring of blood glucose 4 times a day-before meals and at bedtime.   - Patient is warned not to take insulin without proper monitoring per orders.  -Patient is encouraged to call clinic for blood glucose levels less than 70 or above 200 mg /dl. - He would benefit from continuous glucose monitoring. I discussed and prescribed CGM device for him.  - Patient specific target  A1c;  LDL, HDL, Triglycerides, and  Waist Circumference were discussed in detail.  2) BP/HTN:  His blood  pressure is controlled to target, I advised him to continue his current blood pressure medications including ACEI/ARB. 3) Lipids/HPL:   Controlled with LDL at 75.   Patient is advised to continue statins. 4)  Weight/Diet:   he lost 18 pounds overall, CDE Consult has been initiated , exercise, and detailed carbohydrates information provided.  5) Chronic Care/Health Maintenance:  -Patient is on ACEI/ARB and Statin medications and encouraged to continue to follow up with Ophthalmology, Podiatrist at least yearly or according to recommendations, and advised to   stay away from smoking. I have recommended yearly flu vaccine and pneumonia vaccination at least every 5 years; moderate intensity exercise for up to 150 minutes weekly; and  sleep for at least 7 hours a day.   - I advised patient to maintain close follow up with Deloria Lair., MD for primary care needs.  - Time spent with the patient: 25 min, of which >50% was spent in reviewing his blood glucose logs , discussing his hypo- and hyper-glycemic episodes, reviewing his current and  previous labs and insulin doses and developing a plan to avoid hypo- and hyper-glycemia. Please refer to Patient Instructions for Blood Glucose Monitoring and Insulin/Medications Dosing Guide"  in media tab for  additional information.  Follow up plan: - Return in about 3 months (around 06/22/2017) for follow up with pre-visit labs, meter, and logs.  Glade Lloyd, MD Phone: [phone]  Fax: [phone]  This note was partially dictated with voice recognition software. Similar sounding words can be transcribed inadequately or may not  be corrected upon review.  03/24/2017, 5:02 PM

## 2017-03-24 NOTE — Patient Instructions (Signed)

## 2017-07-25 LAB — HEMOGLOBIN A1C: HEMOGLOBIN A1C: 6.2

## 2017-07-25 LAB — BASIC METABOLIC PANEL
BUN: 24 — AB (ref 4–21)
Creatinine: 1.1 (ref <=1.3)

## 2017-08-03 ENCOUNTER — Ambulatory Visit (INDEPENDENT_AMBULATORY_CARE_PROVIDER_SITE_OTHER): Payer: Medicare Other | Admitting: "Endocrinology

## 2017-08-03 ENCOUNTER — Encounter: Payer: Self-pay | Admitting: "Endocrinology

## 2017-08-03 VITALS — BP 135/76 | HR 71 | Ht 72.0 in | Wt 186.0 lb

## 2017-08-03 DIAGNOSIS — I10 Essential (primary) hypertension: Secondary | ICD-10-CM

## 2017-08-03 DIAGNOSIS — E1065 Type 1 diabetes mellitus with hyperglycemia: Secondary | ICD-10-CM | POA: Diagnosis not present

## 2017-08-03 DIAGNOSIS — E782 Mixed hyperlipidemia: Secondary | ICD-10-CM | POA: Diagnosis not present

## 2017-08-03 DIAGNOSIS — IMO0002 Reserved for concepts with insufficient information to code with codable children: Secondary | ICD-10-CM

## 2017-08-03 DIAGNOSIS — E108 Type 1 diabetes mellitus with unspecified complications: Secondary | ICD-10-CM | POA: Diagnosis not present

## 2017-08-03 MED ORDER — INSULIN DEGLUDEC 100 UNIT/ML ~~LOC~~ SOPN: 30.0000 [IU] | PEN_INJECTOR | Freq: Every day | SUBCUTANEOUS | 2 refills | Status: DC

## 2017-08-03 NOTE — Patient Instructions (Signed)

## 2017-08-03 NOTE — Progress Notes (Signed)
Subjective:    Patient ID: William Beck, male    DOB: 06-Oct-1950. Patient is being seen in consultation for management of diabetes requested by  Deloria Lair., MD  Past Medical History:  Diagnosis Date  . Diabetes mellitus type I ([place])   . Hyperlipidemia   . Hypertension    Past Surgical History:  Procedure Laterality Date  . BACK SURGERY     Social History   Socioeconomic History  . Marital status: Widowed    Spouse name: Not on file  . Number of children: Not on file  . Years of education: Not on file  . Highest education level: Not on file  Occupational History  . Not on file  Social Needs  . Financial resource strain: Not on file  . Food insecurity:    Worry: Not on file    Inability: Not on file  . Transportation needs:    Medical: Not on file    Non-medical: Not on file  Tobacco Use  . Smoking status: Never Smoker  . Smokeless tobacco: Never Used  Substance and Sexual Activity  . Alcohol use: No  . Drug use: No  . Sexual activity: Not on file  Lifestyle  . Physical activity:    Days per week: Not on file    Minutes per session: Not on file  . Stress: Not on file  Relationships  . Social connections:    Talks on phone: Not on file    Gets together: Not on file    Attends religious service: Not on file    Active member of club or organization: Not on file    Attends meetings of clubs or organizations: Not on file    Relationship status: Not on file  Other Topics Concern  . Not on file  Social History Narrative  . Not on file   Outpatient Encounter Medications as of 08/03/2017  Medication Sig  . aspirin EC 81 MG tablet Take 81 mg by mouth daily.  . Continuous Blood Gluc Sensor (FREESTYLE LIBRE SENSOR SYSTEM) MISC Use one sensor every 10 days.  . insulin degludec (TRESIBA FLEXTOUCH) 100 UNIT/ML SOPN FlexTouch Pen Inject 0.3 mLs (30 Units total) into the skin daily at 10 pm.  . Insulin Pen Needle (B-D ULTRAFINE III SHORT PEN) 31G X 8 MM MISC  1 each by Does not apply route as directed.  [name] lisinopril-hydrochlorothiazide (PRINZIDE,ZESTORETIC) 20-25 MG tablet Take 1 tablet by mouth daily.  . metoprolol succinate (TOPROL-XL) 25 MG 24 hr tablet Take 25 mg by mouth daily.  . simvastatin (ZOCOR) 40 MG tablet Take 40 mg by mouth daily.  . [DISCONTINUED] insulin degludec (TRESIBA FLEXTOUCH) 100 UNIT/ML SOPN FlexTouch Pen Inject 0.3 mLs (30 Units total) into the skin daily at 10 pm. (Patient taking differently: Inject 25 Units into the skin daily at 10 pm. )  . [DISCONTINUED] NOVOLOG FLEXPEN 100 UNIT/ML FlexPen INJECT 5-11 UNITS INTO THE SKIN 3 (THREE) TIMES DAILY WITH MEALS.  . [DISCONTINUED] TRESIBA FLEXTOUCH 100 UNIT/ML SOPN FlexTouch Pen INJECT 0.25 MLS (25 UNITS TOTAL) INTO THE SKIN DAILY AT 10 PM.   No facility-administered encounter medications on file as of 08/03/2017.    ALLERGIES: No Known Allergies VACCINATION STATUS:  There is no immunization history on file for this patient.  Diabetes  He presents for his follow-up diabetic visit. He has type 1 diabetes mellitus. Onset time: He was diagnosed at approximate age of 38 years. His disease course has been improving. There  are no hypoglycemic associated symptoms. Pertinent negatives for hypoglycemia include no confusion, headaches, hunger, pallor, seizures or sweats. There are no diabetic associated symptoms. Pertinent negatives for diabetes include no chest pain, no fatigue, no polydipsia, no polyphagia, no polyuria and no weakness. There are no hypoglycemic complications. Symptoms are improving. There are no diabetic complications. Risk factors for coronary artery disease include diabetes mellitus, dyslipidemia, hypertension, male sex and sedentary lifestyle. Current diabetic treatment includes insulin injections (He is currently on Humulin and 24 units a.m. and 10 units p.m., Humulin R 17 units a.m. and 17 units p.m.). He is compliant with treatment most of the time. His weight is  decreasing steadily. He is following a diabetic diet. When asked about meal planning, he reported none. He has not had a previous visit with a dietitian. He participates in exercise intermittently. His home blood glucose trend is decreasing steadily. His breakfast blood glucose range is generally 110-130 mg/dl. His lunch blood glucose range is generally 110-130 mg/dl. His dinner blood glucose range is generally 110-130 mg/dl. His bedtime blood glucose range is generally 110-130 mg/dl. His overall blood glucose range is 110-130 mg/dl. An ACE inhibitor/angiotensin II receptor blocker is being taken. He sees a podiatrist.Eye exam is current.  Hyperlipidemia  This is a chronic problem. The current episode started more than 1 year ago. The problem is controlled. Exacerbating diseases include diabetes. Pertinent negatives include no chest pain, myalgias or shortness of breath. Current antihyperlipidemic treatment includes statins. Risk factors for coronary artery disease include diabetes mellitus, dyslipidemia, hypertension and a sedentary lifestyle.  Hypertension  This is a chronic problem. The current episode started more than 1 year ago. The problem is controlled. Pertinent negatives include no chest pain, headaches, neck pain, palpitations, shortness of breath or sweats. Risk factors for coronary artery disease include dyslipidemia, diabetes mellitus and male gender. Past treatments include ACE inhibitors.     Review of Systems  Constitutional: Negative for chills, fatigue, fever and unexpected weight change.  HENT: Negative for dental problem, mouth sores and trouble swallowing.   Eyes: Negative for visual disturbance.  Respiratory: Negative for cough, choking, chest tightness, shortness of breath and wheezing.   Cardiovascular: Negative for chest pain, palpitations and leg swelling.  Gastrointestinal: Negative for abdominal distention, abdominal pain, constipation, diarrhea, nausea and vomiting.   Endocrine: Negative for polydipsia, polyphagia and polyuria.  Genitourinary: Negative for dysuria, flank pain, hematuria and urgency.  Musculoskeletal: Negative for back pain, gait problem, myalgias and neck pain.  Skin: Negative for pallor, rash and wound.  Neurological: Negative for seizures, syncope, weakness, numbness and headaches.  Psychiatric/Behavioral: Negative for confusion and dysphoric mood.    Objective:    BP 135/76   Pulse 71   Ht 6' (1.829 m)   Wt 186 lb (84.4 kg)   BMI 25.23 kg/m   Wt Readings from Last 3 Encounters:  08/03/17 186 lb (84.4 kg)  03/24/17 188 lb (85.3 kg)  12/01/16 192 lb (87.1 kg)    Physical Exam  Constitutional: He is oriented to person, place, and time. He appears well-developed. He is cooperative. No distress.  HENT:  Head: Normocephalic and atraumatic.  Eyes: EOM are normal.  Neck: Normal range of motion. Neck supple. No tracheal deviation present. No thyromegaly present.  Cardiovascular: Normal rate, S1 normal and S2 normal. Exam reveals no gallop.  No murmur heard. Pulses:      Dorsalis pedis pulses are 1+ on the right side, and 1+ on the left side.  Posterior tibial pulses are 1+ on the right side, and 1+ on the left side.  Pulmonary/Chest: Effort normal. No respiratory distress. He has no wheezes.  Abdominal: Soft. He exhibits no distension. There is no tenderness. There is no guarding and no CVA tenderness.  Musculoskeletal: He exhibits no edema.       Right shoulder: He exhibits no swelling and no deformity.  Neurological: He is alert and oriented to person, place, and time. He has normal strength and normal reflexes. No cranial nerve deficit or sensory deficit. Gait normal.  Skin: Skin is warm and dry. No rash noted. No cyanosis. Nails show no clubbing.  Psychiatric: He has a normal mood and affect. His speech is normal. Judgment and thought content normal. Cognition and memory are normal.   Recent Results (from the past 2160  hour(s))  Basic metabolic panel     Status: Abnormal   Collection Time: 07/25/17 12:00 AM  Result Value Ref Range   BUN 24 (A) 4 - 21   Creatinine 1.1 0.6 - 1.3  Hemoglobin A1c     Status: None   Collection Time: 07/25/17 12:00 AM  Result Value Ref Range   Hemoglobin A1C 6.2     Anti-islet antibodies from 11/25/2016 where negative.    Assessment & Plan:   1. diabetes mellitus with complication ([place])- Type unspecified  - Patient has currently controlled asymptomatic diabetes diagnosed since  67 years of age. - His most recent labs from an outside facility showing A1c of 6.2% improving from 6.6%.  - Patient came with his log book showing target fasting blood glucose profile, tight postprandial blood glucose readings.     Patient denies any gross complications, however he remains at a high risk for more acute and chronic complications which include CAD, CVA, CKD, retinopathy, and neuropathy. These are all discussed in detail with the patient.  - I have counseled the patient on diet management and weight loss, by adopting a carbohydrate restricted/protein rich diet.  -  Suggestion is made for him to avoid simple carbohydrates  from his diet including Cakes, Sweet Desserts / Pastries, Ice Cream, Soda (diet and regular), Sweet Tea, Candies, Chips, Cookies, Store Bought Juices, Alcohol in Excess of  1-2 drinks a day, Artificial Sweeteners, and "Sugar-free" Products. This will help patient to have stable blood glucose profile and potentially avoid unintended weight gain.  - I encouraged the patient to switch to  unprocessed or minimally processed complex starch and increased protein intake (animal or plant source), fruits, and vegetables.  - Patient is advised to stick to a routine mealtimes to eat 3 meals  a day and avoid unnecessary snacks ( to snack only to correct hypoglycemia).   - I have approached patient with the following individualized plan to manage diabetes and patient agrees:    -   He came with continued improvement in his  blood glucose profile on insulin analogs, A1c 6.2%. -He has now than Libre CGM device.  I approached him for another attempt to withdraw his prandial insulin. -In preparation, I advised him to increase his [place] to 30 units nightly, continue to document blood glucose 4 times a day-before meals and at bedtime and report if any reading is above 200 mg/dL or less than 70 mg/dL.    -In the meantime, I have advised him to hold NovoLog for now.    - Patient is warned not to take insulin without proper monitoring per orders.  -Patient is encouraged to call clinic  for blood glucose levels less than 70 or above 200 mg /dl.  - Patient specific target  A1c;  LDL, HDL, Triglycerides, and  Waist Circumference were discussed in detail.  2) BP/HTN: His blood pressure is controlled to target.  I advised him to continue lisinopril/HCTZ, and metoprolol.    3) Lipids/HPL: His recent lipid panel showed controlled LDL at 75.  He is advised to continue simvastatin 40 mg p.o. nightly.    4)  Weight/Diet: He has achieved adequate weight loss.  CDE Consult has been initiated , exercise, and detailed carbohydrates information provided.  5) Chronic Care/Health Maintenance:  -Patient is on ACEI/ARB and Statin medications and encouraged to continue to follow up with Ophthalmology, Podiatrist at least yearly or according to recommendations, and advised to   stay away from smoking. I have recommended yearly flu vaccine and pneumonia vaccination at least every 5 years; moderate intensity exercise for up to 150 minutes weekly; and  sleep for at least 7 hours a day.   - I advised patient to maintain close follow up with Deloria Lair., MD for primary care needs.  - Time spent with the patient: 25 min, of which >50% was spent in reviewing his blood glucose logs , discussing his hypo- and hyper-glycemic episodes, reviewing his current and  previous labs and insulin doses  and developing a plan to avoid hypo- and hyper-glycemia. Please refer to Patient Instructions for Blood Glucose Monitoring and Insulin/Medications Dosing Guide"  in media tab for additional information. William Beck participated in the discussions, expressed understanding, and voiced agreement with the above plans.  All questions were answered to his satisfaction. he is encouraged to contact clinic should he have any questions or concerns prior to his return visit.   Follow up plan: - Return in about 4 months (around 12/04/2017) for follow up with pre-visit labs, meter, and logs.  Glade Lloyd, MD Phone: [phone]  Fax: [phone]  This note was partially dictated with voice recognition software. Similar sounding words can be transcribed inadequately or may not  be corrected upon review.  08/03/2017, 5:26 PM

## 2017-08-29 ENCOUNTER — Other Ambulatory Visit: Payer: Self-pay | Admitting: "Endocrinology

## 2017-11-30 LAB — HEMOGLOBIN A1C: Hemoglobin A1C: 5.7

## 2017-11-30 LAB — TSH: TSH: 3.11 (ref 0.41–5.90)

## 2017-11-30 LAB — BASIC METABOLIC PANEL
BUN: 18 (ref 4–21)
Creatinine: 0.9 (ref 0.6–1.3)

## 2017-12-05 ENCOUNTER — Ambulatory Visit: Payer: Medicare Other | Admitting: "Endocrinology

## 2017-12-22 ENCOUNTER — Encounter: Payer: Self-pay | Admitting: "Endocrinology

## 2017-12-22 ENCOUNTER — Ambulatory Visit (INDEPENDENT_AMBULATORY_CARE_PROVIDER_SITE_OTHER): Payer: Medicare Other | Admitting: "Endocrinology

## 2017-12-22 VITALS — BP 134/79 | HR 71 | Ht 72.0 in | Wt 180.0 lb

## 2017-12-22 DIAGNOSIS — I10 Essential (primary) hypertension: Secondary | ICD-10-CM

## 2017-12-22 DIAGNOSIS — E1065 Type 1 diabetes mellitus with hyperglycemia: Secondary | ICD-10-CM | POA: Diagnosis not present

## 2017-12-22 DIAGNOSIS — E782 Mixed hyperlipidemia: Secondary | ICD-10-CM

## 2017-12-22 DIAGNOSIS — IMO0002 Reserved for concepts with insufficient information to code with codable children: Secondary | ICD-10-CM

## 2017-12-22 DIAGNOSIS — E108 Type 1 diabetes mellitus with unspecified complications: Secondary | ICD-10-CM

## 2017-12-22 MED ORDER — INSULIN DEGLUDEC 100 UNIT/ML ~~LOC~~ SOPN: 25.0000 [IU] | PEN_INJECTOR | Freq: Every day | SUBCUTANEOUS | 2 refills | Status: DC

## 2017-12-22 NOTE — Progress Notes (Signed)
Endocrinology follow-up note   Subjective:    Patient ID: William Beck, male    DOB: 02-19-1951. Patient is being seen in follow-up for management of diabetes requested by  Deloria Lair., MD  Past Medical History:  Diagnosis Date  . Diabetes mellitus type I ([place])   . Hyperlipidemia   . Hypertension    Past Surgical History:  Procedure Laterality Date  . BACK SURGERY     Social History   Socioeconomic History  . Marital status: Widowed    Spouse name: Not on file  . Number of children: Not on file  . Years of education: Not on file  . Highest education level: Not on file  Occupational History  . Not on file  Social Needs  . Financial resource strain: Not on file  . Food insecurity:    Worry: Not on file    Inability: Not on file  . Transportation needs:    Medical: Not on file    Non-medical: Not on file  Tobacco Use  . Smoking status: Never Smoker  . Smokeless tobacco: Never Used  Substance and Sexual Activity  . Alcohol use: No  . Drug use: No  . Sexual activity: Not on file  Lifestyle  . Physical activity:    Days per week: Not on file    Minutes per session: Not on file  . Stress: Not on file  Relationships  . Social connections:    Talks on phone: Not on file    Gets together: Not on file    Attends religious service: Not on file    Active member of club or organization: Not on file    Attends meetings of clubs or organizations: Not on file    Relationship status: Not on file  Other Topics Concern  . Not on file  Social History Narrative  . Not on file   Outpatient Encounter Medications as of 12/22/2017  Medication Sig  . aspirin EC 81 MG tablet Take 81 mg by mouth daily.  . Continuous Blood Gluc Sensor (FREESTYLE LIBRE SENSOR SYSTEM) MISC Use one sensor every 10 days.  . insulin aspart (NOVOLOG) 100 UNIT/ML FlexPen Inject into the skin 3 (three) times daily before meals.  . insulin degludec (TRESIBA FLEXTOUCH) 100 UNIT/ML SOPN  FlexTouch Pen Inject 0.25 mLs (25 Units total) into the skin daily at 10 pm.  . Insulin Pen Needle (B-D ULTRAFINE III SHORT PEN) 31G X 8 MM MISC 1 each by Does not apply route as directed.  [name] lisinopril-hydrochlorothiazide (PRINZIDE,ZESTORETIC) 20-25 MG tablet Take 1 tablet by mouth daily.  . metoprolol succinate (TOPROL-XL) 25 MG 24 hr tablet Take 25 mg by mouth daily.  . simvastatin (ZOCOR) 40 MG tablet Take 40 mg by mouth daily.  . [DISCONTINUED] insulin degludec (TRESIBA FLEXTOUCH) 100 UNIT/ML SOPN FlexTouch Pen Inject 0.3 mLs (30 Units total) into the skin daily at 10 pm.  . [DISCONTINUED] insulin degludec (TRESIBA FLEXTOUCH) 100 UNIT/ML SOPN FlexTouch Pen Inject 0.3 mLs (30 Units total) into the skin daily at 10 pm.  . [DISCONTINUED] insulin degludec (TRESIBA FLEXTOUCH) 100 UNIT/ML SOPN FlexTouch Pen Inject 0.3 mLs (30 Units total) into the skin daily at 10 pm.   No facility-administered encounter medications on file as of 12/22/2017.    ALLERGIES: No Known Allergies VACCINATION STATUS:  There is no immunization history on file for this patient.  Diabetes  He presents for his follow-up diabetic visit. He has type 1 diabetes mellitus. Onset time: He  was diagnosed at approximate age of 4 years. His disease course has been improving. There are no hypoglycemic associated symptoms. Pertinent negatives for hypoglycemia include no confusion, headaches, hunger, pallor, seizures or sweats. There are no diabetic associated symptoms. Pertinent negatives for diabetes include no chest pain, no fatigue, no polydipsia, no polyphagia, no polyuria and no weakness. There are no hypoglycemic complications. Symptoms are improving. There are no diabetic complications. Risk factors for coronary artery disease include diabetes mellitus, dyslipidemia, hypertension, male sex and sedentary lifestyle. Current diabetic treatment includes insulin injections (He is currently on Humulin and 24 units a.m. and 10 units  p.m., Humulin R 17 units a.m. and 17 units p.m.). He is compliant with treatment most of the time. His weight is decreasing steadily. He is following a diabetic diet. When asked about meal planning, he reported none. He has not had a previous visit with a dietitian. He participates in exercise intermittently. His home blood glucose trend is decreasing steadily. His breakfast blood glucose range is generally 110-130 mg/dl. His lunch blood glucose range is generally 110-130 mg/dl. His dinner blood glucose range is generally 110-130 mg/dl. His bedtime blood glucose range is generally 110-130 mg/dl. His overall blood glucose range is 110-130 mg/dl. An ACE inhibitor/angiotensin II receptor blocker is being taken. He sees a podiatrist.Eye exam is current.  Hyperlipidemia  This is a chronic problem. The current episode started more than 1 year ago. The problem is controlled. Exacerbating diseases include diabetes. Pertinent negatives include no chest pain, myalgias or shortness of breath. Current antihyperlipidemic treatment includes statins. Risk factors for coronary artery disease include diabetes mellitus, dyslipidemia, hypertension and a sedentary lifestyle.  Hypertension  This is a chronic problem. The current episode started more than 1 year ago. The problem is controlled. Pertinent negatives include no chest pain, headaches, neck pain, palpitations, shortness of breath or sweats. Risk factors for coronary artery disease include dyslipidemia, diabetes mellitus and male gender. Past treatments include ACE inhibitors.     Review of Systems  Constitutional: Negative for chills, fatigue, fever and unexpected weight change.  HENT: Negative for dental problem, mouth sores and trouble swallowing.   Eyes: Negative for visual disturbance.  Respiratory: Negative for cough, choking, chest tightness, shortness of breath and wheezing.   Cardiovascular: Negative for chest pain, palpitations and leg swelling.   Gastrointestinal: Negative for abdominal distention, abdominal pain, constipation, diarrhea, nausea and vomiting.  Endocrine: Negative for polydipsia, polyphagia and polyuria.  Genitourinary: Negative for dysuria, flank pain, hematuria and urgency.  Musculoskeletal: Negative for back pain, gait problem, myalgias and neck pain.  Skin: Negative for pallor, rash and wound.  Neurological: Negative for seizures, syncope, weakness, numbness and headaches.  Psychiatric/Behavioral: Negative for confusion and dysphoric mood.    Objective:    BP 134/79   Pulse 71   Ht 6' (1.829 m)   Wt 180 lb (81.6 kg)   BMI 24.41 kg/m   Wt Readings from Last 3 Encounters:  12/22/17 180 lb (81.6 kg)  08/03/17 186 lb (84.4 kg)  03/24/17 188 lb (85.3 kg)    Physical Exam  Constitutional: He is oriented to person, place, and time. He appears well-developed. He is cooperative. No distress.  HENT:  Head: Normocephalic and atraumatic.  Eyes: EOM are normal.  Neck: Normal range of motion. Neck supple. No tracheal deviation present. No thyromegaly present.  Cardiovascular: Normal rate, S1 normal and S2 normal. Exam reveals no gallop.  No murmur heard. Pulses:      Dorsalis pedis pulses  are 1+ on the right side, and 1+ on the left side.       Posterior tibial pulses are 1+ on the right side, and 1+ on the left side.  Pulmonary/Chest: Effort normal. No respiratory distress. He has no wheezes.  Abdominal: Soft. He exhibits no distension. There is no tenderness. There is no guarding and no CVA tenderness.  Musculoskeletal: He exhibits no edema.       Right shoulder: He exhibits no swelling and no deformity.  Neurological: He is alert and oriented to person, place, and time. He has normal strength. No cranial nerve deficit or sensory deficit. Gait normal.  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 No rash noted. No cyanosis. Nails show no clubbing.  Vitiligo on right upper forehead  Psychiatric:  He has a normal mood and affect. His speech is normal. Judgment and thought content normal. Cognition and memory are normal.    Anti-islet antibodies from 11/25/2016 where negative. Recent Results (from the past 2160 hour(s))  Basic metabolic panel     Status: None   Collection Time: 11/30/17 12:00 AM  Result Value Ref Range   BUN 18 4 - 21   Creatinine 0.9 0.6 - 1.3  Hemoglobin A1c     Status: None   Collection Time: 11/30/17 12:00 AM  Result Value Ref Range   Hemoglobin A1C 5.7   TSH     Status: None   Collection Time: 11/30/17 12:00 AM  Result Value Ref Range   TSH 3.11 0.41 - 5.90    Comment: free t4 1.42    He is November 30, 2017 labs show high antibodies: Acetylcholine receptor binding antibodies 9.6, acetylcholine receptor blocking antibodies 52, acetylcholine modulating antibodies pending, anti-striation antibodies 1: 160 high.    Assessment & Plan:   1. diabetes mellitus with complication ([place])- Type unspecified likely type 1  - Patient has currently controlled asymptomatic diabetes diagnosed since  67 years of age. - His most recent labs from an outside facility showing A1c of 5.7%, improving from 6.6%.     - Patient came with his log book showing controlled fasting and postprandial glycemic profile.      Patient denies any gross complications, however he remains at a high risk for more acute and chronic complications which include CAD, CVA, CKD, retinopathy, and neuropathy. These are all discussed in detail with the patient.  - I have counseled the patient on diet management and weight loss, by adopting a carbohydrate restricted/protein rich diet.  -  Suggestion is made for him to avoid simple carbohydrates  from his diet including Cakes, Sweet Desserts / Pastries, Ice Cream, Soda (diet and regular), Sweet Tea, Candies, Chips, Cookies, Store Bought Juices, Alcohol in Excess of  1-2 drinks a day, Artificial Sweeteners, and "Sugar-free" Products. This will help  patient to have stable blood glucose profile and potentially avoid unintended weight gain.   - I encouraged the patient to switch to  unprocessed or minimally processed complex starch and increased protein intake (animal or plant source), fruits, and vegetables.  - Patient is advised to stick to a routine mealtimes to eat 3 meals  a day and avoid unnecessary snacks ( to snack only to correct hypoglycemia).   - I have approached patient with the following individualized plan to manage diabetes and patient agrees:   -An attempt to simplify his treatment to only basal insulin did not work out. -He is advised to continue [place] 25 units nightly,  NovoLog 6 units 3 times daily AC for pre-meal blood glucose readings above 90 mg/dL, associated with strict monitoring of blood glucose 4 times a day- before meals and at bedtime.  -He did not like to use the [place] device and wishes to stay on finger sticking.  - Patient is warned not to take insulin without proper monitoring per orders.  -Patient is encouraged to call clinic for blood glucose levels less than 70 or above 200 mg /dl.  - Patient specific target  A1c;  LDL, HDL, Triglycerides, and  Waist Circumference were discussed in detail.  2) BP/HTN: His blood pressure is controlled to target.  He is advised to continue lisinopril/HCTZ, and metoprolol.    3) Lipids/HPL: His recent lipid panel showed controlled LDL at 75.  He is advised to continue simvastatin 40 mg p.o. nightly.    4)  Weight/Diet: He has achieved adequate weight loss.  CDE Consult has been initiated , exercise, and detailed carbohydrates information provided.  5) Chronic Care/Health Maintenance:  -Patient is on ACEI/ARB and Statin medications and encouraged to continue to follow up with Ophthalmology, Podiatrist at least yearly or according to recommendations, and advised to   stay away from smoking. I have recommended yearly flu vaccine and pneumonia vaccination at least every  5 years; moderate intensity exercise for up to 150 minutes weekly; and  sleep for at least 7 hours a day.  -He has labs suspicious for myasthenia gravis.  He is scheduled to see a neurologist on January 16, 2018.  He is urged not to miss that appointment.   - I advised patient to maintain close follow up with Deloria Lair., MD for primary care needs.  - Time spent with the patient: 25 min, of which >50% was spent in reviewing his blood glucose logs , discussing his hypo- and hyper-glycemic episodes, reviewing his current and  previous labs and insulin doses and developing a plan to avoid hypo- and hyper-glycemia. Please refer to Patient Instructions for Blood Glucose Monitoring and Insulin/Medications Dosing Guide"  in media tab for additional information. Heron Nay participated in the discussions, expressed understanding, and voiced agreement with the above plans.  All questions were answered to his satisfaction. he is encouraged to contact clinic should he have any questions or concerns prior to his return visit.   Follow up plan: - Return in about 6 months (around 06/23/2018) for Follow up with Pre-visit Labs, Meter, and Logs.  Glade Lloyd, MD Phone: [phone]  Fax: [phone]  This note was partially dictated with voice recognition software. Similar sounding words can be transcribed inadequately or may not  be corrected upon review.  12/22/2017, 5:06 PM

## 2018-01-16 ENCOUNTER — Other Ambulatory Visit: Payer: Self-pay | Admitting: Neurology

## 2018-01-16 DIAGNOSIS — G7000 Myasthenia gravis without (acute) exacerbation: Secondary | ICD-10-CM

## 2018-01-29 ENCOUNTER — Ambulatory Visit: Payer: Medicare Other | Attending: Neurology | Admitting: Neurology

## 2018-01-29 DIAGNOSIS — G4730 Sleep apnea, unspecified: Secondary | ICD-10-CM

## 2018-01-29 DIAGNOSIS — G4733 Obstructive sleep apnea (adult) (pediatric): Secondary | ICD-10-CM | POA: Insufficient documentation

## 2018-02-03 ENCOUNTER — Encounter (HOSPITAL_COMMUNITY): Payer: Self-pay

## 2018-02-03 ENCOUNTER — Ambulatory Visit (HOSPITAL_COMMUNITY): Admission: RE | Admit: 2018-02-03 | Payer: Medicare Other | Source: Ambulatory Visit

## 2018-02-12 NOTE — Procedures (Signed)
   [place] A. Merlene Laughter, MD     [email or web address]             NOCTURNAL POLYSOMNOGRAPHY   LOCATION: [facility]  Patient Name: William Beck, William Beck Date: 01/29/2018 Gender: Male D.O.B: 10-25-1950 Age (years): 35 Referring Provider: Phillips Odor MD, ABSM Height (inches): 72 Interpreting Physician: Phillips Odor MD, ABSM Weight (lbs): 180 RPSGT: Rosebud Poles BMI: 24 MRN: [id number] Neck Size: 15.00 CLINICAL INFORMATION Sleep Study Type: NPSG     Indication for sleep study: N/A     Epworth Sleepiness Score: 8     SLEEP STUDY TECHNIQUE As per the AASM Manual for the Scoring of Sleep and Associated Events v2.3 (April 2016) with a hypopnea requiring 4% desaturations.  The channels recorded and monitored were frontal, central and occipital EEG, electrooculogram (EOG), submentalis EMG (chin), nasal and oral airflow, thoracic and abdominal wall motion, anterior tibialis EMG, snore microphone, electrocardiogram, and pulse oximetry.  MEDICATIONS Medications self-administered by patient taken the night of the study : N/A     SLEEP ARCHITECTURE The study was initiated at 9:48:09 PM and ended at 4:56:40 AM.  Sleep onset time was 28.8 minutes and the sleep efficiency was 39.6%%. The total sleep time was 169.8 minutes.  Stage REM latency was 56.5 minutes.  The patient spent 13.5%% of the night in stage N1 sleep, 68.9%% in stage N2 sleep, 3.8%% in stage N3 and 13.7% in REM.  Alpha intrusion was absent.  Supine sleep was 20.62%.  RESPIRATORY PARAMETERS The overall apnea/hypopnea index (AHI) was 12.4 per hour. There were 2 total apneas, including 1 obstructive, 1 central and 0 mixed apneas. There were 33 hypopneas and 0 RERAs.  The AHI during Stage REM sleep was 20.6 per hour.  AHI while supine was 27.4 per hour.  The mean oxygen saturation was 94.2%. The minimum SpO2 during sleep was 88.0%.  moderate snoring was noted during this  study.  CARDIAC DATA The 2 lead EKG demonstrated sinus rhythm. The mean heart rate was 68.1 beats per minute. Other EKG findings include: None. LEG MOVEMENT DATA The total PLMS were 0 with a resulting PLMS index of 0.0. Associated arousal with leg movement index was 2.5.  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 A formal CPAP titration study is suggested. Poor sleep efficiency is noted.   Delano Metz, MD Diplomate, American Board of Sleep Medicine. ELECTRONICALLY SIGNED ON:  02/12/2018, 7:54 PM [place] PH: [phone]   FX: [phone] [place]

## 2018-02-15 ENCOUNTER — Other Ambulatory Visit: Payer: Self-pay | Admitting: "Endocrinology

## 2018-03-30 ENCOUNTER — Encounter (HOSPITAL_COMMUNITY): Payer: Self-pay

## 2018-03-30 ENCOUNTER — Ambulatory Visit (HOSPITAL_COMMUNITY)
Admission: RE | Admit: 2018-03-30 | Discharge: 2018-03-30 | Disposition: A | Discharge status: Home / Self Care | Payer: Medicare Other | Source: Ambulatory Visit | Attending: Neurology | Admitting: Neurology

## 2018-03-30 DIAGNOSIS — G7000 Myasthenia gravis without (acute) exacerbation: Secondary | ICD-10-CM | POA: Diagnosis present

## 2018-03-30 LAB — POCT I-STAT CREATININE: Creatinine, Ser: 1 mg/dL (ref 0.61–1.24)

## 2018-03-30 MED ORDER — IOPAMIDOL (ISOVUE-300) INJECTION 61%: 75.0000 mL | Freq: Once | INTRAVENOUS | Status: DC | PRN

## 2018-03-30 MED ORDER — IOHEXOL 300 MG/ML  SOLN
75.0000 mL | Freq: Once | INTRAMUSCULAR | Status: AC | PRN
  Administered 2018-03-30: 75 mL via INTRAVENOUS

## 2018-05-25 ENCOUNTER — Telehealth: Payer: Self-pay | Admitting: Cardiology

## 2018-05-25 NOTE — Telephone Encounter (Signed)
° °   Left message for patient to call back to answer...   COVID-19 Pre-Screening Questions:   Have you recently travelled abroad or to [place], [place] state, or [place]? no  Do you currently have a fever? no  Have you been in contact with someone that is currently pending confirmation of Covid19 testing or has been confirmed to have the [place] virus?  no  Are you currently experiencing fatigue or cough? no  Are you currently experiencing new or worsening shortness of breath at rest or with minimal activity?no  Have you been in contact with someone that was recently sick with fever/cough/fatigue? no   **A score of 4 or more should result in cancellation of the pts cardiology appt.  **A score of  2 should be provided a mask prior to admission into the lobby  *Travel to a high risk area or contact with a confirmed case should stay at home,  away from confirmed patient, monitor symptoms, and reach out to PCP for e-visit,  additional testing.  *ALL PTS W/ FEVER SHOULD BE REFERRED TO PCP FOR E-VISIT*

## 2018-05-26 ENCOUNTER — Encounter: Payer: Self-pay | Admitting: Cardiology

## 2018-05-26 ENCOUNTER — Other Ambulatory Visit: Payer: Self-pay

## 2018-05-26 ENCOUNTER — Ambulatory Visit (INDEPENDENT_AMBULATORY_CARE_PROVIDER_SITE_OTHER): Payer: Medicare Other | Admitting: Cardiology

## 2018-05-26 ENCOUNTER — Encounter: Payer: Self-pay | Admitting: *Deleted

## 2018-05-26 VITALS — BP 88/46 | HR 73 | Ht 73.0 in | Wt 171.2 lb

## 2018-05-26 DIAGNOSIS — I10 Essential (primary) hypertension: Secondary | ICD-10-CM | POA: Diagnosis not present

## 2018-05-26 DIAGNOSIS — I2510 Atherosclerotic heart disease of native coronary artery without angina pectoris: Secondary | ICD-10-CM | POA: Diagnosis not present

## 2018-05-26 DIAGNOSIS — I2584 Coronary atherosclerosis due to calcified coronary lesion: Secondary | ICD-10-CM

## 2018-05-26 NOTE — Progress Notes (Signed)
Clinical Summary Mr. Faux is a 68 y.o.male seen today as a new consult, referred by Dr Merlene Laughter for coronary artery calcifications.   1. Coronary artery calficication  - incidental finding Jan 2020 CT chest, aortic atherosclerosis with extensive coronary artery calcification - no regular chest pain. Chronic SOB he attributes to his myasthenia gravis - highest level of activity home exercise, no related symptoms   CAD risk factors: DMI, HL, HTN    2. Myasthenia gravis -followed by Dr Merlene Laughter   3. HTN - does not check at home. - no lightheadness or dizziness.  - no N/V/D, has had some decreased appetite. Significant weight loss over the last several months Past Medical History:  Diagnosis Date  . Diabetes mellitus type I ([place])   . Hyperlipidemia   . Hypertension      No Known Allergies   Current Outpatient Medications  Medication Sig Dispense Refill  . aspirin EC 81 MG tablet Take 81 mg by mouth daily.    . Continuous Blood Gluc Sensor (FREESTYLE LIBRE SENSOR SYSTEM) MISC Use one sensor every 10 days. 3 each 2  . insulin aspart (NOVOLOG) 100 UNIT/ML FlexPen Inject into the skin 3 (three) times daily before meals.    . insulin degludec (TRESIBA FLEXTOUCH) 100 UNIT/ML SOPN FlexTouch Pen Inject 0.25 mLs (25 Units total) into the skin daily at 10 pm. 5 pen 2  . Insulin Pen Needle (B-D ULTRAFINE III SHORT PEN) 31G X 8 MM MISC 1 each by Does not apply route as directed. 100 each 3  . lisinopril-hydrochlorothiazide (PRINZIDE,ZESTORETIC) 20-25 MG tablet Take 1 tablet by mouth daily.    . metoprolol succinate (TOPROL-XL) 25 MG 24 hr tablet Take 25 mg by mouth daily.    . simvastatin (ZOCOR) 40 MG tablet Take 40 mg by mouth daily.    . TRESIBA FLEXTOUCH 100 UNIT/ML SOPN FlexTouch Pen INJECT 30 UNITS (0.3ML) INTO THE SKIN DAILY AT 10 PM. 15 mL 2   No current facility-administered medications for this visit.      Past Surgical History:  Procedure Laterality Date  . BACK  SURGERY       No Known Allergies    Family History  Problem Relation Age of Onset  . Heart attack Mother   . Stroke Mother   . Diabetes Father   . Heart attack Brother      Social History Mr. Faulk reports that he has never smoked. He has never used smokeless tobacco. Mr. Shackett reports no history of alcohol use.   Review of Systems CONSTITUTIONAL: No weight loss, fever, chills, weakness or fatigue.  HEENT: Eyes: No visual loss, blurred vision, double vision or yellow sclerae.No hearing loss, sneezing, congestion, runny nose or sore throat.  SKIN: No rash or itching.  CARDIOVASCULAR: per hpi RESPIRATORY: No shortness of breath, cough or sputum.  GASTROINTESTINAL: No anorexia, nausea, vomiting or diarrhea. No abdominal pain or blood.  GENITOURINARY: No burning on urination, no polyuria NEUROLOGICAL: No headache, dizziness, syncope, paralysis, ataxia, numbness or tingling in the extremities. No change in bowel or bladder control.  MUSCULOSKELETAL: No muscle, back pain, joint pain or stiffness.  LYMPHATICS: No enlarged nodes. No history of splenectomy.  PSYCHIATRIC: No history of depression or anxiety.  ENDOCRINOLOGIC: No reports of sweating, cold or heat intolerance. No polyuria or polydipsia.  [name]   Physical Examination Today's Vitals   05/26/18 0916 05/26/18 0924  BP: (!) 94/56 (!) 88/46  Pulse: 68 73  SpO2: 99%   Weight:  171 lb 3.2 oz (77.7 kg)   Height: 6\' 1"  (1.854 m)    Body mass index is 22.59 kg/m.  Gen: resting comfortably, no acute distress HEENT: no scleral icterus, pupils equal round and reactive, no palptable cervical adenopathy,  CV: RRR, 3/6 systolic murmur apex, no jvd Resp: Clear to auscultation bilaterally GI: abdomen is soft, non-tender, non-distended, normal bowel sounds, no hepatosplenomegaly MSK: extremities are warm, no edema.  Skin: warm, no rash Neuro:  no focal deficits Psych: appropriate affect     Assessment and Plan  1.  Coronary artery calcifications - noted incidentally on CT, he does have CAD risk factors - no significant symptoms - would consider stress testing in the future for risk stratification. In absence of symptoms this is an elective procedure, and given coronarvirus risks would delay at this time, reconsider at f/u or if symptomatic - he is on ASA, statin, ACE-I  2. HTN - manual repeat bp 110/60, continue current meds.    3. Heart murmur - he thinks he had an echo at [facility] within last few years, we will check with there records department - if not consider echo at f/u. In absence of symptoms this is also an elective procedure and would delay at this time.    F/u 4 months. Consider stress testing at that time.    Arnoldo Lenis, M.D.

## 2018-05-26 NOTE — Patient Instructions (Signed)
Your physician wants you to follow-up in: 4 MONTHS WITH DR BRANCH You will receive a reminder letter in the mail two months in advance. If you don't receive a letter, please call our office to schedule the follow-up appointment.  Your physician recommends that you continue on your current medications as directed. Please refer to the Current Medication list given to you today.  Thank you for choosing [facility]!!   

## 2018-06-26 ENCOUNTER — Ambulatory Visit: Payer: Medicare Other | Admitting: "Endocrinology

## 2018-08-04 ENCOUNTER — Other Ambulatory Visit: Payer: Self-pay | Admitting: "Endocrinology

## 2018-08-24 ENCOUNTER — Other Ambulatory Visit: Payer: Self-pay | Admitting: "Endocrinology

## 2018-09-22 LAB — BASIC METABOLIC PANEL
BUN: 21 (ref 4–21)
Creatinine: 1 (ref 0.6–1.3)

## 2018-09-22 LAB — TSH: TSH: 3.6 (ref 0.41–5.90)

## 2018-09-22 LAB — HEMOGLOBIN A1C: Hemoglobin A1C: 6.3

## 2018-09-28 ENCOUNTER — Ambulatory Visit: Payer: Medicare Other | Admitting: "Endocrinology

## 2018-10-13 ENCOUNTER — Ambulatory Visit: Payer: Medicare Other | Admitting: "Endocrinology

## 2018-10-17 ENCOUNTER — Encounter: Payer: Self-pay | Admitting: "Endocrinology

## 2018-10-17 ENCOUNTER — Other Ambulatory Visit: Payer: Self-pay

## 2018-10-17 ENCOUNTER — Ambulatory Visit (INDEPENDENT_AMBULATORY_CARE_PROVIDER_SITE_OTHER): Payer: Medicare Other | Admitting: "Endocrinology

## 2018-10-17 VITALS — BP 101/72 | HR 62 | Wt 170.0 lb

## 2018-10-17 DIAGNOSIS — I10 Essential (primary) hypertension: Secondary | ICD-10-CM | POA: Diagnosis not present

## 2018-10-17 DIAGNOSIS — E782 Mixed hyperlipidemia: Secondary | ICD-10-CM | POA: Diagnosis not present

## 2018-10-17 DIAGNOSIS — E1065 Type 1 diabetes mellitus with hyperglycemia: Secondary | ICD-10-CM

## 2018-10-17 DIAGNOSIS — I2510 Atherosclerotic heart disease of native coronary artery without angina pectoris: Secondary | ICD-10-CM | POA: Diagnosis not present

## 2018-10-17 DIAGNOSIS — IMO0002 Reserved for concepts with insufficient information to code with codable children: Secondary | ICD-10-CM

## 2018-10-17 DIAGNOSIS — E108 Type 1 diabetes mellitus with unspecified complications: Secondary | ICD-10-CM | POA: Diagnosis not present

## 2018-10-17 DIAGNOSIS — I2584 Coronary atherosclerosis due to calcified coronary lesion: Secondary | ICD-10-CM

## 2018-10-17 NOTE — Progress Notes (Signed)
10/17/2018   Endocrinology follow-up note   Subjective:    Patient ID: William Beck, male    DOB: 1950/11/04. Patient is being seen in follow-up for management of diabetes requested by  Sandi Mealy, MD  Past Medical History:  Diagnosis Date  . Diabetes mellitus type I ([place])   . Hyperlipidemia   . Hypertension    Past Surgical History:  Procedure Laterality Date  . BACK SURGERY     Social History   Socioeconomic History  . Marital status: Widowed    Spouse name: Not on file  . Number of children: Not on file  . Years of education: Not on file  . Highest education level: Not on file  Occupational History  . Not on file  Social Needs  . Financial resource strain: Not on file  . Food insecurity    Worry: Not on file    Inability: Not on file  . Transportation needs    Medical: Not on file    Non-medical: Not on file  Tobacco Use  . Smoking status: Never Smoker  . Smokeless tobacco: Never Used  Substance and Sexual Activity  . Alcohol use: No  . Drug use: No  . Sexual activity: Not on file  Lifestyle  . Physical activity    Days per week: Not on file    Minutes per session: Not on file  . Stress: Not on file  Relationships  . Social Herbalist on phone: Not on file    Gets together: Not on file    Attends religious service: Not on file    Active member of club or organization: Not on file    Attends meetings of clubs or organizations: Not on file    Relationship status: Not on file  Other Topics Concern  . Not on file  Social History Narrative  . Not on file   Outpatient Encounter Medications as of 10/17/2018  Medication Sig  . aspirin EC 81 MG tablet Take 81 mg by mouth daily.  . Continuous Blood Gluc Sensor (FREESTYLE LIBRE SENSOR SYSTEM) MISC Use one sensor every 10 days.  . insulin aspart (NOVOLOG) 100 UNIT/ML FlexPen Inject into the skin 3 (three) times daily before meals.  . insulin degludec (TRESIBA FLEXTOUCH) 100 UNIT/ML SOPN  FlexTouch Pen Inject 0.25 mLs (25 Units total) into the skin daily at 10 pm.  . Insulin Pen Needle (B-D ULTRAFINE III SHORT PEN) 31G X 8 MM MISC 1 each by Does not apply route as directed.  [name] lisinopril-hydrochlorothiazide (PRINZIDE,ZESTORETIC) 20-25 MG tablet Take 1 tablet by mouth daily.  . metoprolol succinate (TOPROL-XL) 25 MG 24 hr tablet Take 25 mg by mouth daily.  . predniSONE (DELTASONE) 10 MG tablet Take 10 mg by mouth daily with breakfast.  . pyridostigmine (MESTINON) 60 MG tablet Take 30 mg by mouth 3 (three) times daily.  . simvastatin (ZOCOR) 40 MG tablet Take 40 mg by mouth daily.  . [DISCONTINUED] TRESIBA FLEXTOUCH 100 UNIT/ML SOPN FlexTouch Pen INJECT 30 UNITS INTO THE SKIN DAILY AT 10 PM.   No facility-administered encounter medications on file as of 10/17/2018.    ALLERGIES: No Known Allergies VACCINATION STATUS:  There is no immunization history on file for this patient.  Diabetes He presents for his follow-up diabetic visit. He has type 1 diabetes mellitus. Onset time: He was diagnosed at approximate age of 65 years. His disease course has been stable. There are no hypoglycemic associated symptoms. Pertinent negatives for  hypoglycemia include no confusion, headaches, hunger, pallor, seizures or sweats. There are no diabetic associated symptoms. Pertinent negatives for diabetes include no chest pain, no fatigue, no polydipsia, no polyphagia, no polyuria and no weakness. There are no hypoglycemic complications. Symptoms are stable. There are no diabetic complications. Risk factors for coronary artery disease include diabetes mellitus, dyslipidemia, hypertension, male sex and sedentary lifestyle. Current diabetic treatment includes insulin injections (He is currently on Humulin and 24 units a.m. and 10 units p.m., Humulin R 17 units a.m. and 17 units p.m.). He is compliant with treatment most of the time. His weight is decreasing steadily. He is following a diabetic diet. When asked  about meal planning, he reported none. He has not had a previous visit with a dietitian. He participates in exercise intermittently. His home blood glucose trend is decreasing steadily. His breakfast blood glucose range is generally 130-140 mg/dl. His lunch blood glucose range is generally 130-140 mg/dl. His dinner blood glucose range is generally 130-140 mg/dl. His bedtime blood glucose range is generally 130-140 mg/dl. His overall blood glucose range is 130-140 mg/dl. An ACE inhibitor/angiotensin II receptor blocker is being taken. He sees a podiatrist.Eye exam is current.  Hyperlipidemia This is a chronic problem. The current episode started more than 1 year ago. The problem is controlled. Exacerbating diseases include diabetes. Pertinent negatives include no chest pain, myalgias or shortness of breath. Current antihyperlipidemic treatment includes statins. Risk factors for coronary artery disease include diabetes mellitus, dyslipidemia, hypertension and a sedentary lifestyle.  Hypertension This is a chronic problem. The current episode started more than 1 year ago. The problem is controlled. Pertinent negatives include no chest pain, headaches, neck pain, palpitations, shortness of breath or sweats. Risk factors for coronary artery disease include dyslipidemia, diabetes mellitus and male gender. Past treatments include ACE inhibitors.     Review of Systems  Constitutional: Negative for chills, fatigue, fever and unexpected weight change.  HENT: Negative for dental problem, mouth sores and trouble swallowing.   Eyes: Negative for visual disturbance.  Respiratory: Negative for cough, choking, chest tightness, shortness of breath and wheezing.   Cardiovascular: Negative for chest pain, palpitations and leg swelling.  Gastrointestinal: Negative for abdominal distention, abdominal pain, constipation, diarrhea, nausea and vomiting.  Endocrine: Negative for polydipsia, polyphagia and polyuria.   Genitourinary: Negative for dysuria, flank pain, hematuria and urgency.  Musculoskeletal: Negative for back pain, gait problem, myalgias and neck pain.  Skin: Negative for pallor, rash and wound.  Neurological: Negative for seizures, syncope, weakness, numbness and headaches.  Psychiatric/Behavioral: Negative for confusion and dysphoric mood.    Objective:    BP 101/72   Pulse 62   Wt 170 lb (77.1 kg)   BMI 22.43 kg/m   Wt Readings from Last 3 Encounters:  10/17/18 170 lb (77.1 kg)  05/26/18 171 lb 3.2 oz (77.7 kg)  12/22/17 180 lb (81.6 kg)    Physical Exam  Constitutional: He is oriented to person, place, and time. He appears well-developed. He is cooperative. No distress.  HENT:  Head: Normocephalic and atraumatic.  Eyes: EOM are normal.  Neck: Normal range of motion. Neck supple. No tracheal deviation present. No thyromegaly present.  Cardiovascular: Normal rate, S1 normal and S2 normal. Exam reveals no gallop.  No murmur heard. Pulses:      Dorsalis pedis pulses are 1+ on the right side and 1+ on the left side.       Posterior tibial pulses are 1+ on the right side and 1+  on the left side.  Pulmonary/Chest: Effort normal. No respiratory distress. He has no wheezes.  Abdominal: Soft. He exhibits no distension. There is no abdominal tenderness. There is no guarding and no CVA tenderness.  Musculoskeletal:        General: No edema.     Right shoulder: He exhibits no swelling and no deformity.  Neurological: He is alert and oriented to person, place, and time. He has normal strength. No cranial nerve deficit or sensory deficit. Gait normal.  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 No rash noted. No cyanosis. Nails show no clubbing.  Vitiligo on right upper forehead  Psychiatric: He has a normal mood and affect. His speech is normal. Judgment and thought content normal. Cognition and memory are normal.    Anti-islet antibodies from 11/25/2016 where  negative. Recent Results (from the past 2160 hour(s))  Basic metabolic panel     Status: None   Collection Time: 09/22/18 12:00 AM  Result Value Ref Range   BUN 21 4 - 21   Creatinine 1.0 0.6 - 1.3  Hemoglobin A1c     Status: None   Collection Time: 09/22/18 12:00 AM  Result Value Ref Range   Hemoglobin A1C 6.3   TSH     Status: None   Collection Time: 09/22/18 12:00 AM  Result Value Ref Range   TSH 3.60 0.41 - 5.90    Comment: ft4 1.39    He is November 30, 2017 labs show high antibodies: Acetylcholine receptor binding antibodies 9.6, acetylcholine receptor blocking antibodies 52, acetylcholine modulating antibodies pending, anti-striation antibodies 1: 160 high.    Assessment & Plan:   1. diabetes mellitus with complication ([place])- Type unspecified likely type 1  - Patient has currently controlled asymptomatic diabetes diagnosed since  67 years of age. - His most recent labs from an outside facility showing A1c of 6.3%.   - Patient came with his log book showing controlled fasting and postprandial glycemic profile.      Patient denies any gross complications, however he remains at a high risk for more acute and chronic complications which include CAD, CVA, CKD, retinopathy, and neuropathy. These are all discussed in detail with the patient.  - I have counseled the patient on diet management by adopting a carbohydrate restricted/protein rich diet.  - he  admits there is a room for improvement in his diet and drink choices. -  Suggestion is made for him to avoid simple carbohydrates  from his diet including Cakes, Sweet Desserts / Pastries, Ice Cream, Soda (diet and regular), Sweet Tea, Candies, Chips, Cookies, Sweet Pastries,  Store Bought Juices, Alcohol in Excess of  1-2 drinks a day, Artificial Sweeteners, Coffee Creamer, and "Sugar-free" Products. This will help patient to have stable blood glucose profile and potentially avoid unintended weight gain.   - I encouraged the  patient to switch to  unprocessed or minimally processed complex starch and increased protein intake (animal or plant source), fruits, and vegetables.  - Patient is advised to stick to a routine mealtimes to eat 3 meals  a day and avoid unnecessary snacks ( to snack only to correct hypoglycemia).   - I have approached patient with the following individualized plan to manage diabetes and patient agrees:   -A previous attempt to simplify his treatment to only basal insulin did not work out. -He is advised to continue Tresiba 25 units nightly, NovoLog 6 units 3 times daily AC  for pre-meal blood  glucose readings above 90 mg/dL, associated with strict monitoring of blood glucose 4 times a day- before meals and at bedtime.  -He did not like to use the [place] device and wishes to stay on finger sticking.  - Patient is warned not to take insulin without proper monitoring per orders.  -Patient is encouraged to call clinic for blood glucose levels less than 70 or above 200 mg /dl.  - Patient specific target  A1c;  LDL, HDL, Triglycerides, and  Waist Circumference were discussed in detail.  2) BP/HTN: His blood pressure is controlled to target.  He is advised to continue lisinopril/HCTZ, and metoprolol.    3) Lipids/HPL: His recent lipid panel showed controlled LDL at 75.  He is advised to continue simvastatin 40 mg p.o. nightly.     4)  Weight/Diet: He has achieved adequate weight loss.  CDE Consult has been initiated , exercise, and detailed carbohydrates information provided.  5) Chronic Care/Health Maintenance:  -Patient is on ACEI/ARB and Statin medications and encouraged to continue to follow up with Ophthalmology, Podiatrist at least yearly or according to recommendations, and advised to   stay away from smoking. I have recommended yearly flu vaccine and pneumonia vaccination at least every 5 years; moderate intensity exercise for up to 150 minutes weekly; and  sleep for at least 7 hours a  day.   - I advised patient to maintain close follow up with Sandi Mealy, MD for primary care needs.  - Patient Care Time Today:  25 min, of which >50% was spent in  counseling and the rest reviewing his  current and  previous labs/studies, previous treatments, his blood glucose readings, and medications' doses and developing a plan for long-term care based on the latest recommendations for standards of care.   Heron Nay participated in the discussions, expressed understanding, and voiced agreement with the above plans.  All questions were answered to his satisfaction. he is encouraged to contact clinic should he have any questions or concerns prior to his return visit.   Follow up plan: - Return in about 6 months (around 04/19/2019) for Follow up with Pre-visit Labs, Meter, and Logs.  Glade Lloyd, MD Phone: [phone]  Fax: [phone]  This note was partially dictated with voice recognition software. Similar sounding words can be transcribed inadequately or may not  be corrected upon review.  10/17/2018, 5:25 PM

## 2019-04-24 ENCOUNTER — Ambulatory Visit: Payer: Medicare Other | Admitting: "Endocrinology

## 2019-04-30 LAB — HEMOGLOBIN A1C: Hemoglobin A1C: 6.8

## 2019-04-30 LAB — BASIC METABOLIC PANEL
BUN: 19 (ref 4–21)
Creatinine: 1.1 (ref 0.6–1.3)

## 2019-04-30 LAB — LIPID PANEL
Cholesterol: 139 (ref 0–200)
HDL: 41 (ref 35–70)
LDL Cholesterol: 71
Triglycerides: 159 (ref 40–160)

## 2019-05-03 ENCOUNTER — Telehealth: Payer: Self-pay | Admitting: "Endocrinology

## 2019-05-03 MED ORDER — INSULIN ASPART 100 UNIT/ML FLEXPEN: 6.0000 [IU] | PEN_INJECTOR | Freq: Three times a day (TID) | SUBCUTANEOUS | 0 refills | Status: DC

## 2019-05-03 MED ORDER — TRESIBA FLEXTOUCH 100 UNIT/ML ~~LOC~~ SOPN: 25.0000 [IU] | PEN_INJECTOR | Freq: Every day | SUBCUTANEOUS | 0 refills | Status: DC

## 2019-05-03 NOTE — Telephone Encounter (Signed)
Patient requesting refill on Novolog and Tresbia. He has not had a RX in a while because he was getting some from his brother in law because he had some that he no longer used. [facility] on [address]

## 2019-05-03 NOTE — Telephone Encounter (Signed)
Rx refills for tresiba and novolog sent. Pt has upcoming appointment on 05-07-19.

## 2019-05-07 ENCOUNTER — Ambulatory Visit (INDEPENDENT_AMBULATORY_CARE_PROVIDER_SITE_OTHER): Payer: Medicare Other | Admitting: "Endocrinology

## 2019-05-07 ENCOUNTER — Encounter: Payer: Self-pay | Admitting: "Endocrinology

## 2019-05-07 DIAGNOSIS — E782 Mixed hyperlipidemia: Secondary | ICD-10-CM

## 2019-05-07 DIAGNOSIS — E1065 Type 1 diabetes mellitus with hyperglycemia: Secondary | ICD-10-CM | POA: Diagnosis not present

## 2019-05-07 DIAGNOSIS — I10 Essential (primary) hypertension: Secondary | ICD-10-CM | POA: Diagnosis not present

## 2019-05-07 DIAGNOSIS — E108 Type 1 diabetes mellitus with unspecified complications: Secondary | ICD-10-CM

## 2019-05-07 DIAGNOSIS — IMO0002 Reserved for concepts with insufficient information to code with codable children: Secondary | ICD-10-CM

## 2019-05-07 MED ORDER — FREESTYLE LIBRE 14 DAY SENSOR MISC: 1.0000 | 2 refills | Status: AC

## 2019-05-07 MED ORDER — FREESTYLE LIBRE 14 DAY READER DEVI: 1.0000 | Freq: Once | 0 refills | Status: AC

## 2019-05-07 NOTE — Progress Notes (Signed)
05/07/2019                                                     Endocrinology Telehealth Visit Follow up Note -During COVID -19 Pandemic  This visit type was conducted due to national recommendations for restrictions regarding the COVID-19 Pandemic  in an effort to limit this patient's exposure and mitigate transmission of the corona virus.  Due to his co-morbid illnesses, William Beck is at  moderate to high risk for complications without adequate follow up.  This format is felt to be most appropriate for him at this time.  I connected with this patient on 05/07/2019   by telephone and verified that I am speaking with the correct person using two identifiers. William Beck, 10-Mar-1950. he has verbally consented to this visit. All issues noted in this document were discussed and addressed. The format was not optimal for physical exam.  He was assisted by his wife during his visit.  Subjective:    Patient ID: William Beck, male    DOB: [birth year]. Patient is being engaged in telehealth via telephone  in follow-up for management of diabetes requested by  Sandi Mealy, MD  Past Medical History:  Diagnosis Date  . Diabetes mellitus type I ([place])   . Hyperlipidemia   . Hypertension    Past Surgical History:  Procedure Laterality Date  . BACK SURGERY     Social History   Socioeconomic History  . Marital status: Widowed    Spouse name: Not on file  . Number of children: Not on file  . Years of education: Not on file  . Highest education level: Not on file  Occupational History  . Not on file  Tobacco Use  . Smoking status: Never Smoker  . Smokeless tobacco: Never Used  Substance and Sexual Activity  . Alcohol use: No  . Drug use: No  . Sexual activity: Not on file  Other Topics Concern  . Not on file  Social History Narrative  . Not on file   Social Determinants of Health   Financial Resource Strain:   . Difficulty of Paying Living Expenses: Not on file  Food  Insecurity:   . Worried About Charity fundraiser in the Last Year: Not on file  . Ran Out of Food in the Last Year: Not on file  Transportation Needs:   . Lack of Transportation (Medical): Not on file  . Lack of Transportation (Non-Medical): Not on file  Physical Activity:   . Days of Exercise per Week: Not on file  . Minutes of Exercise per Session: Not on file  Stress:   . Feeling of Stress : Not on file  Social Connections:   . Frequency of Communication with Friends and Family: Not on file  . Frequency of Social Gatherings with Friends and Family: Not on file  . Attends Religious Services: Not on file  . Active Member of Clubs or Organizations: Not on file  . Attends Archivist Meetings: Not on file  . Marital Status: Not on file   Outpatient Encounter Medications as of 05/07/2019  Medication Sig  . aspirin EC 81 MG tablet Take 81 mg by mouth daily.  . Continuous Blood Gluc Receiver (FREESTYLE LIBRE 14 DAY READER) DEVI 1 each by Does not apply route  once for 1 dose.  . Continuous Blood Gluc Sensor (FREESTYLE LIBRE 14 DAY SENSOR) MISC Inject 1 each into the skin every 14 (fourteen) days. Use as directed.  . insulin aspart (NOVOLOG) 100 UNIT/ML FlexPen Inject 6 Units into the skin 3 (three) times daily before meals.  . insulin degludec (TRESIBA FLEXTOUCH) 100 UNIT/ML SOPN FlexTouch Pen Inject 0.25 mLs (25 Units total) into the skin daily at 10 pm.  . Insulin Pen Needle (B-D ULTRAFINE III SHORT PEN) 31G X 8 MM MISC 1 each by Does not apply route as directed.  [name] lisinopril-hydrochlorothiazide (PRINZIDE,ZESTORETIC) 20-25 MG tablet Take 1 tablet by mouth daily.  . metoprolol succinate (TOPROL-XL) 25 MG 24 hr tablet Take 25 mg by mouth daily.  . predniSONE (DELTASONE) 10 MG tablet Take 10 mg by mouth daily with breakfast.  . pyridostigmine (MESTINON) 60 MG tablet Take 30 mg by mouth 3 (three) times daily.  . simvastatin (ZOCOR) 40 MG tablet Take 40 mg by mouth daily.  .  [DISCONTINUED] Continuous Blood Gluc Sensor (FREESTYLE LIBRE SENSOR SYSTEM) MISC Use one sensor every 10 days.   No facility-administered encounter medications on file as of 05/07/2019.   ALLERGIES: No Known Allergies VACCINATION STATUS:  There is no immunization history on file for this patient.  Diabetes He presents for his follow-up diabetic visit. He has type 1 diabetes mellitus. Onset time: He was diagnosed at approximate age of 55 years. His disease course has been stable. There are no hypoglycemic associated symptoms. Pertinent negatives for hypoglycemia include no confusion, headaches, hunger, pallor, seizures or sweats. There are no diabetic associated symptoms. Pertinent negatives for diabetes include no chest pain, no fatigue, no polydipsia, no polyphagia, no polyuria and no weakness. There are no hypoglycemic complications. Symptoms are stable. There are no diabetic complications. Risk factors for coronary artery disease include diabetes mellitus, dyslipidemia, hypertension, male sex and sedentary lifestyle. Current diabetic treatment includes insulin injections (He is currently on Humulin and 24 units a.m. and 10 units p.m., Humulin R 17 units a.m. and 17 units p.m.). He is compliant with treatment most of the time. His weight is decreasing steadily. He is following a diabetic diet. When asked about meal planning, he reported none. He has not had a previous visit with a dietitian. He participates in exercise intermittently. His home blood glucose trend is decreasing steadily. His breakfast blood glucose range is generally 130-140 mg/dl. His lunch blood glucose range is generally 130-140 mg/dl. His dinner blood glucose range is generally 130-140 mg/dl. His bedtime blood glucose range is generally 130-140 mg/dl. His overall blood glucose range is 130-140 mg/dl. An ACE inhibitor/angiotensin II receptor blocker is being taken. He sees a podiatrist.Eye exam is current.  Hyperlipidemia This is a  chronic problem. The current episode started more than 1 year ago. The problem is controlled. Exacerbating diseases include diabetes. Pertinent negatives include no chest pain, myalgias or shortness of breath. Current antihyperlipidemic treatment includes statins. Risk factors for coronary artery disease include diabetes mellitus, dyslipidemia, hypertension and a sedentary lifestyle.  Hypertension This is a chronic problem. The current episode started more than 1 year ago. The problem is controlled. Pertinent negatives include no chest pain, headaches, neck pain, palpitations, shortness of breath or sweats. Risk factors for coronary artery disease include dyslipidemia, diabetes mellitus and male gender. Past treatments include ACE inhibitors.   Review of systems: Limited as above.  Objective:    There were no vitals taken for this visit.  Wt Readings from Last 3 Encounters:  10/17/18 170 lb (77.1 kg)  05/26/18 171 lb 3.2 oz (77.7 kg)  12/22/17 180 lb (81.6 kg)     Anti-islet antibodies from 11/25/2016 where negative. Recent Results (from the past 2160 hour(s))  Basic metabolic panel     Status: None   Collection Time: 04/30/19 12:00 AM  Result Value Ref Range   BUN 19 4 - 21   Creatinine 1.1 0.6 - 1.3  Lipid panel     Status: None   Collection Time: 04/30/19 12:00 AM  Result Value Ref Range   Triglycerides 159 40 - 160   Cholesterol 139 0 - 200   HDL 41 35 - 70   LDL Cholesterol 71   Hemoglobin A1c     Status: None   Collection Time: 04/30/19 12:00 AM  Result Value Ref Range   Hemoglobin A1C 6.8       Assessment & Plan:   1. diabetes mellitus with complication ([place])- Type unspecified likely type 1  - Patient has currently controlled asymptomatic diabetes diagnosed since  69 years of age. -His wife is reporting that he is getting some rare, random hypoglycemic episodes.  One of them was severe enough for them to call EMS.   There is a concern of insulin/meals mismatch,  patient getting confused. - His most recent labs from an outside facility showing A1c of 6.8%.   - Patient came with his log book showing controlled fasting and postprandial glycemic profile.      Patient denies any gross complications, however he remains at a high risk for more acute and chronic complications which include CAD, CVA, CKD, retinopathy, and neuropathy. These are all discussed in detail with the patient.  - I have counseled the patient on diet management by adopting a carbohydrate restricted/protein rich diet.  - he  admits there is a room for improvement in his diet and drink choices. -  Suggestion is made for him to avoid simple carbohydrates  from his diet including Cakes, Sweet Desserts / Pastries, Ice Cream, Soda (diet and regular), Sweet Tea, Candies, Chips, Cookies, Sweet Pastries,  Store Bought Juices, Alcohol in Excess of  1-2 drinks a day, Artificial Sweeteners, Coffee Creamer, and "Sugar-free" Products. This will help patient to have stable blood glucose profile and potentially avoid unintended weight gain.   - I encouraged the patient to switch to  unprocessed or minimally processed complex starch and increased protein intake (animal or plant source), fruits, and vegetables.  - Patient is advised to stick to a routine mealtimes to eat 3 meals  a day and avoid unnecessary snacks ( to snack only to correct hypoglycemia).   - I have approached patient with the following individualized plan to manage diabetes and patient agrees:   -A previous attempt to simplify his treatment to only basal insulin did not work out. -He will continue to require intensive treatment with basal/bolus insulin in order for him to achieve and maintain control of diabetes to target.  -He will need continuous glucose monitoring device which he previously declined.  His wife is requesting, discussed and prescribed the freestyle libre device for him.   -He will need mostly one-on-one assistance with  meals and insulin administration.  I advised him to continue Tresiba 25 units nightly, NovoLog  6 units 3 times daily AC  for pre-meal blood glucose readings above 90 mg/dL, associated with strict monitoring of blood glucose 4 times a day- before meals and at bedtime.  -He is offered a phone visit in  10 days for reevaluation.  - Patient is warned not to take insulin without proper monitoring per orders.  -Patient is encouraged to call clinic for blood glucose levels less than 70 or above 200 mg /dl.  - Patient specific target  A1c;  LDL, HDL, Triglycerides, and  Waist Circumference were discussed in detail.  2) BP/HTN:  he is advised to home monitor blood pressure and report if > 140/90 on 2 separate readings.  He is advised to continue lisinopril/HCTZ, and metoprolol.    3) Lipids/HPL: His recent lipid panel showed controlled LDL at 75.  He is advised to continue simvastatin 40 mg p.o. nightly.     4)  Weight/Diet: He is not a candidate for weight loss.  CDE Consult has been initiated , exercise, and detailed carbohydrates information provided.  5) Chronic Care/Health Maintenance:  -Patient is on ACEI/ARB and Statin medications and encouraged to continue to follow up with Ophthalmology, Podiatrist at least yearly or according to recommendations, and advised to   stay away from smoking. I have recommended yearly flu vaccine and pneumonia vaccination at least every 5 years; moderate intensity exercise for up to 150 minutes weekly; and  sleep for at least 7 hours a day.   - I advised patient to maintain close follow up with Sandi Mealy, MD for primary care needs.  - Time spent on this patient care encounter:  35 min, of which >50% was spent in  counseling and the rest reviewing his  current and  previous labs/studies ( including abstraction from other facilities),  previous treatments, his blood glucose readings, and medications' doses and developing a plan for long-term care based on  the latest recommendations for standards of care; and documenting his care.  William Beck participated in the discussions, expressed understanding, and voiced agreement with the above plans.  All questions were answered to his satisfaction. he is encouraged to contact clinic should he have any questions or concerns prior to his return visit.   Follow up plan: - Return in about 10 days (around 05/17/2019), or Phone, for Follow up with Meter and Logs Only - no Labs.  Glade Lloyd, MD Phone: [phone]  Fax: [phone]  This note was partially dictated with voice recognition software. Similar sounding words can be transcribed inadequately or may not  be corrected upon review.  05/07/2019, 10:15 PM

## 2019-05-15 ENCOUNTER — Other Ambulatory Visit: Payer: Self-pay | Admitting: Family Medicine

## 2019-05-15 DIAGNOSIS — G7000 Myasthenia gravis without (acute) exacerbation: Secondary | ICD-10-CM

## 2019-05-15 DIAGNOSIS — R4189 Other symptoms and signs involving cognitive functions and awareness: Secondary | ICD-10-CM

## 2019-05-15 DIAGNOSIS — G9340 Encephalopathy, unspecified: Secondary | ICD-10-CM

## 2019-05-17 ENCOUNTER — Encounter: Payer: Self-pay | Admitting: "Endocrinology

## 2019-05-17 ENCOUNTER — Ambulatory Visit (INDEPENDENT_AMBULATORY_CARE_PROVIDER_SITE_OTHER): Payer: Medicare Other | Admitting: "Endocrinology

## 2019-05-17 DIAGNOSIS — E1065 Type 1 diabetes mellitus with hyperglycemia: Secondary | ICD-10-CM

## 2019-05-17 DIAGNOSIS — E782 Mixed hyperlipidemia: Secondary | ICD-10-CM

## 2019-05-17 DIAGNOSIS — E108 Type 1 diabetes mellitus with unspecified complications: Secondary | ICD-10-CM

## 2019-05-17 DIAGNOSIS — I10 Essential (primary) hypertension: Secondary | ICD-10-CM | POA: Diagnosis not present

## 2019-05-17 DIAGNOSIS — IMO0002 Reserved for concepts with insufficient information to code with codable children: Secondary | ICD-10-CM

## 2019-05-17 MED ORDER — TRESIBA FLEXTOUCH 100 UNIT/ML ~~LOC~~ SOPN: 20.0000 [IU] | PEN_INJECTOR | Freq: Every day | SUBCUTANEOUS | 1 refills | Status: DC

## 2019-05-17 MED ORDER — INSULIN ASPART 100 UNIT/ML FLEXPEN: 8.0000 [IU] | PEN_INJECTOR | Freq: Three times a day (TID) | SUBCUTANEOUS | 1 refills | Status: DC

## 2019-05-17 NOTE — Patient Instructions (Signed)

## 2019-05-17 NOTE — Progress Notes (Signed)
05/17/2019                                                     Endocrinology Telehealth Visit Follow up Note -During COVID -19 Pandemic  This visit type was conducted due to national recommendations for restrictions regarding the COVID-19 Pandemic  in an effort to limit this patient's exposure and mitigate transmission of the corona virus.  Due to his co-morbid illnesses, William Beck is at  moderate to high risk for complications without adequate follow up.  This format is felt to be most appropriate for him at this time.  I connected with this patient on 05/17/2019   by telephone and verified that I am speaking with the correct person using two identifiers. William Beck, April 29, 1950. he has verbally consented to this visit. All issues noted in this document were discussed and addressed. The format was not optimal for physical exam.  He was assisted by his wife during his visit.  Subjective:    Patient ID: William Beck, male    DOB: [birth year]. Patient is being engaged in telehealth via telephone  in follow-up for management of diabetes requested by  Leeanne Rio, MD  Past Medical History:  Diagnosis Date  . Diabetes mellitus type I ([place])   . Hyperlipidemia   . Hypertension    Past Surgical History:  Procedure Laterality Date  . BACK SURGERY     Social History   Socioeconomic History  . Marital status: Widowed    Spouse name: Not on file  . Number of children: Not on file  . Years of education: Not on file  . Highest education level: Not on file  Occupational History  . Not on file  Tobacco Use  . Smoking status: Never Smoker  . Smokeless tobacco: Never Used  Substance and Sexual Activity  . Alcohol use: No  . Drug use: No  . Sexual activity: Not on file  Other Topics Concern  . Not on file  Social History Narrative  . Not on file   Social Determinants of Health   Financial Resource Strain:   . Difficulty of Paying Living Expenses:   Food  Insecurity:   . Worried About Charity fundraiser in the Last Year:   . Arboriculturist in the Last Year:   Transportation Needs:   . Film/video editor (Medical):   [name] Lack of Transportation (Non-Medical):   Physical Activity:   . Days of Exercise per Week:   . Minutes of Exercise per Session:   Stress:   . Feeling of Stress :   Social Connections:   . Frequency of Communication with Friends and Family:   . Frequency of Social Gatherings with Friends and Family:   . Attends Religious Services:   . Active Member of Clubs or Organizations:   . Attends Archivist Meetings:   [name] Marital Status:    Outpatient Encounter Medications as of 05/17/2019  Medication Sig  . aspirin EC 81 MG tablet Take 81 mg by mouth daily.  . Continuous Blood Gluc Sensor (FREESTYLE LIBRE 14 DAY SENSOR) MISC Inject 1 each into the skin every 14 (fourteen) days. Use as directed.  . insulin aspart (NOVOLOG) 100 UNIT/ML FlexPen Inject 8-11 Units into the skin 3 (three) times daily with meals.  [name]  insulin degludec (TRESIBA FLEXTOUCH) 100 UNIT/ML FlexTouch Pen Inject 0.2 mLs (20 Units total) into the skin at bedtime.  . Insulin Pen Needle (B-D ULTRAFINE III SHORT PEN) 31G X 8 MM MISC 1 each by Does not apply route as directed.  [name] lisinopril-hydrochlorothiazide (PRINZIDE,ZESTORETIC) 20-25 MG tablet Take 1 tablet by mouth daily.  . metoprolol succinate (TOPROL-XL) 25 MG 24 hr tablet Take 25 mg by mouth daily.  . predniSONE (DELTASONE) 10 MG tablet Take 10 mg by mouth daily with breakfast.  . pyridostigmine (MESTINON) 60 MG tablet Take 30 mg by mouth 3 (three) times daily.  . simvastatin (ZOCOR) 40 MG tablet Take 40 mg by mouth daily.  . [DISCONTINUED] insulin aspart (NOVOLOG) 100 UNIT/ML FlexPen Inject 6 Units into the skin 3 (three) times daily before meals.  . [DISCONTINUED] insulin degludec (TRESIBA FLEXTOUCH) 100 UNIT/ML SOPN FlexTouch Pen Inject 0.25 mLs (25 Units total) into the skin daily at 10 pm.    No facility-administered encounter medications on file as of 05/17/2019.   ALLERGIES: No Known Allergies VACCINATION STATUS:  There is no immunization history on file for this patient.  Diabetes He presents for his follow-up diabetic visit. He has type 1 diabetes mellitus. Onset time: He was diagnosed at approximate age of 61 years. His disease course has been fluctuating. There are no hypoglycemic associated symptoms. Pertinent negatives for hypoglycemia include no confusion, headaches, hunger, pallor, seizures or sweats. There are no diabetic associated symptoms. Pertinent negatives for diabetes include no chest pain, no fatigue, no polydipsia, no polyphagia, no polyuria and no weakness. There are no hypoglycemic complications. Symptoms are stable. There are no diabetic complications. Risk factors for coronary artery disease include diabetes mellitus, dyslipidemia, hypertension, male sex and sedentary lifestyle. Current diabetic treatment includes insulin injections (He is currently on Humulin and 24 units a.m. and 10 units p.m., Humulin R 17 units a.m. and 17 units p.m.). He is compliant with treatment most of the time. His weight is decreasing steadily. He is following a diabetic diet. When asked about meal planning, he reported none. He has not had a previous visit with a dietitian. He participates in exercise intermittently. His home blood glucose trend is decreasing steadily. His breakfast blood glucose range is generally 110-130 mg/dl. His lunch blood glucose range is generally 180-200 mg/dl. His dinner blood glucose range is generally 180-200 mg/dl. His bedtime blood glucose range is generally 180-200 mg/dl. His overall blood glucose range is 180-200 mg/dl. An ACE inhibitor/angiotensin II receptor blocker is being taken. He sees a podiatrist.Eye exam is current.  Hyperlipidemia This is a chronic problem. The current episode started more than 1 year ago. The problem is controlled. Exacerbating  diseases include diabetes. Pertinent negatives include no chest pain, myalgias or shortness of breath. Current antihyperlipidemic treatment includes statins. Risk factors for coronary artery disease include diabetes mellitus, dyslipidemia, hypertension and a sedentary lifestyle.  Hypertension This is a chronic problem. The current episode started more than 1 year ago. The problem is controlled. Pertinent negatives include no chest pain, headaches, neck pain, palpitations, shortness of breath or sweats. Risk factors for coronary artery disease include dyslipidemia, diabetes mellitus and male gender. Past treatments include ACE inhibitors.   Review of systems: Limited as above.  Objective:    There were no vitals taken for this visit.  Wt Readings from Last 3 Encounters:  10/17/18 170 lb (77.1 kg)  05/26/18 171 lb 3.2 oz (77.7 kg)  12/22/17 180 lb (81.6 kg)     Anti-islet antibodies  from 11/25/2016 where negative. Recent Results (from the past 2160 hour(s))  Basic metabolic panel     Status: None   Collection Time: 04/30/19 12:00 AM  Result Value Ref Range   BUN 19 4 - 21   Creatinine 1.1 0.6 - 1.3  Lipid panel     Status: None   Collection Time: 04/30/19 12:00 AM  Result Value Ref Range   Triglycerides 159 40 - 160   Cholesterol 139 0 - 200   HDL 41 35 - 70   LDL Cholesterol 71   Hemoglobin A1c     Status: None   Collection Time: 04/30/19 12:00 AM  Result Value Ref Range   Hemoglobin A1C 6.8       Assessment & Plan:   1. diabetes mellitus with complication ([place])- Type unspecified likely type 1  - Patient has currently controlled asymptomatic diabetes diagnosed since  69 years of age. -His wife is reporting that he is getting some rare, random fasting  hypoglycemic episodes.  He still has postprandial hyperglycemia. There is a concern of insulin/meals mismatch, patient getting confused. - His most recent labs from an outside facility showing A1c of 6.8%.     Patient denies  any gross complications, however he remains at a high risk for more acute and chronic complications which include CAD, CVA, CKD, retinopathy, and neuropathy. These are all discussed in detail with the patient.  - I have counseled the patient on diet management by adopting a carbohydrate restricted/protein rich diet.  - he  admits there is a room for improvement in his diet and drink choices. -  Suggestion is made for him to avoid simple carbohydrates  from his diet including Cakes, Sweet Desserts / Pastries, Ice Cream, Soda (diet and regular), Sweet Tea, Candies, Chips, Cookies, Sweet Pastries,  Store Bought Juices, Alcohol in Excess of  1-2 drinks a day, Artificial Sweeteners, Coffee Creamer, and "Sugar-free" Products. This will help patient to have stable blood glucose profile and potentially avoid unintended weight gain.   - I encouraged the patient to switch to  unprocessed or minimally processed complex starch and increased protein intake (animal or plant source), fruits, and vegetables.  - Patient is advised to stick to a routine mealtimes to eat 3 meals  a day and avoid unnecessary snacks ( to snack only to correct hypoglycemia).   - I have approached patient with the following individualized plan to manage diabetes and patient agrees:    -He will continue to require intensive treatment with basal/bolus insulin in order for him to achieve and maintain control of diabetes to target.  -He will need continuous glucose monitoring device which he previously declined, awaiting rx decision.  -He will need mostly one-on-one assistance with meals and insulin administration.  He is  advised him to lower  [place] to 20 units nightly, increase NovoLog  To 8  units 3 times daily AC  for pre-meal blood glucose readings above 90 mg/dL, associated with strict monitoring of blood glucose 4 times a day- before meals and at bedtime.   - Patient is warned not to take insulin without proper monitoring per  orders.  -Patient is encouraged to call clinic for blood glucose levels less than 70 or above 200 mg /dl.  - Patient specific target  A1c;  LDL, HDL, Triglycerides, and  Waist Circumference were discussed in detail.  2) BP/HTN:  he is advised to home monitor blood pressure and report if > 140/90 on 2 separate readings.  He is advised to continue lisinopril/HCTZ, and metoprolol.    3) Lipids/HPL: His recent lipid panel showed controlled LDL at 75.  He is advised to continue Simvastatin 40 mg p.o. nightly.    4)  Weight/Diet: He is not a candidate for weight loss.  CDE Consult has been initiated , exercise, and detailed carbohydrates information provided.  5) Chronic Care/Health Maintenance:  -Patient is on ACEI/ARB and Statin medications and encouraged to continue to follow up with Ophthalmology, Podiatrist at least yearly or according to recommendations, and advised to   stay away from smoking. I have recommended yearly flu vaccine and pneumonia vaccination at least every 5 years; moderate intensity exercise for up to 150 minutes weekly; and  sleep for at least 7 hours a day.   - I advised patient to maintain close follow up with Leeanne Rio, MD for primary care needs.  -He will have a  Nurse visit for CGM application if he receives his package. - Time spent on this patient care encounter:  35 min, of which >50% was spent in  counseling and the rest reviewing his  current and  previous labs/studies ( including abstraction from other facilities),  previous treatments, his blood glucose readings, and medications' doses and developing a plan for long-term care based on the latest recommendations for standards of care; and documenting his care.  William Beck participated in the discussions, expressed understanding, and voiced agreement with the above plans.  All questions were answered to his satisfaction. he is encouraged to contact clinic should he have any questions or concerns  prior to his return visit.    Follow up plan: - Return in about 9 weeks (around 07/19/2019) for Bring Meter and Logs- A1c in Office.  Glade Lloyd, MD Phone: [phone]  Fax: [phone]  This note was partially dictated with voice recognition software. Similar sounding words can be transcribed inadequately or may not  be corrected upon review.  05/17/2019, 6:36 PM

## 2019-06-03 ENCOUNTER — Other Ambulatory Visit: Payer: Self-pay | Admitting: "Endocrinology

## 2019-06-07 ENCOUNTER — Other Ambulatory Visit: Payer: Medicare Other

## 2019-06-09 ENCOUNTER — Ambulatory Visit
Admission: RE | Admit: 2019-06-09 | Discharge: 2019-06-09 | Disposition: A | Discharge status: Home / Self Care | Payer: Medicare Other | Source: Ambulatory Visit | Attending: Family Medicine | Admitting: Family Medicine

## 2019-06-09 DIAGNOSIS — R4189 Other symptoms and signs involving cognitive functions and awareness: Secondary | ICD-10-CM

## 2019-06-09 DIAGNOSIS — G9340 Encephalopathy, unspecified: Secondary | ICD-10-CM

## 2019-06-09 DIAGNOSIS — G7000 Myasthenia gravis without (acute) exacerbation: Secondary | ICD-10-CM

## 2019-06-09 MED ORDER — GADOBENATE DIMEGLUMINE 529 MG/ML IV SOLN
16.0000 mL | Freq: Once | INTRAVENOUS | Status: AC | PRN
  Administered 2019-06-09: 16 mL via INTRAVENOUS

## 2019-06-14 ENCOUNTER — Other Ambulatory Visit: Payer: Self-pay | Admitting: "Endocrinology

## 2019-07-20 ENCOUNTER — Other Ambulatory Visit: Payer: Self-pay

## 2019-07-20 ENCOUNTER — Encounter: Payer: Self-pay | Admitting: "Endocrinology

## 2019-07-20 ENCOUNTER — Ambulatory Visit (INDEPENDENT_AMBULATORY_CARE_PROVIDER_SITE_OTHER): Payer: Medicare Other | Admitting: "Endocrinology

## 2019-07-20 VITALS — BP 123/72 | HR 72 | Ht 73.0 in | Wt 165.2 lb

## 2019-07-20 DIAGNOSIS — I10 Essential (primary) hypertension: Secondary | ICD-10-CM | POA: Diagnosis not present

## 2019-07-20 DIAGNOSIS — E108 Type 1 diabetes mellitus with unspecified complications: Secondary | ICD-10-CM | POA: Diagnosis not present

## 2019-07-20 DIAGNOSIS — E1065 Type 1 diabetes mellitus with hyperglycemia: Secondary | ICD-10-CM

## 2019-07-20 DIAGNOSIS — IMO0002 Reserved for concepts with insufficient information to code with codable children: Secondary | ICD-10-CM

## 2019-07-20 DIAGNOSIS — E782 Mixed hyperlipidemia: Secondary | ICD-10-CM | POA: Diagnosis not present

## 2019-07-20 MED ORDER — TRESIBA FLEXTOUCH 100 UNIT/ML ~~LOC~~ SOPN: 20.0000 [IU] | PEN_INJECTOR | Freq: Every day | SUBCUTANEOUS | 2 refills | Status: DC

## 2019-07-20 MED ORDER — NOVOLOG FLEXPEN 100 UNIT/ML ~~LOC~~ SOPN: 6.0000 [IU] | PEN_INJECTOR | Freq: Three times a day (TID) | SUBCUTANEOUS | 2 refills | Status: DC

## 2019-07-20 NOTE — Progress Notes (Signed)
07/20/2019  Endocrinology follow-up note  He was assisted by his wife during his visit.  Subjective:    Patient ID: William Beck, male    DOB: Jun 09, 1950.  He is being seen in follow-up for the management of his type 1 diabetes. He also has hyperlipidemia, hypertension.    PMD:  Leeanne Rio, MD  Past Medical History:  Diagnosis Date  . Diabetes mellitus type I ([place])   . Hyperlipidemia   . Hypertension    Past Surgical History:  Procedure Laterality Date  . BACK SURGERY     Social History   Socioeconomic History  . Marital status: Widowed    Spouse name: Not on file  . Number of children: Not on file  . Years of education: Not on file  . Highest education level: Not on file  Occupational History  . Not on file  Tobacco Use  . Smoking status: Never Smoker  . Smokeless tobacco: Never Used  Substance and Sexual Activity  . Alcohol use: No  . Drug use: No  . Sexual activity: Not on file  Other Topics Concern  . Not on file  Social History Narrative  . Not on file   Social Determinants of Health   Financial Resource Strain:   . Difficulty of Paying Living Expenses:   Food Insecurity:   . Worried About Charity fundraiser in the Last Year:   . Arboriculturist in the Last Year:   Transportation Needs:   . Film/video editor (Medical):   [name] Lack of Transportation (Non-Medical):   Physical Activity:   . Days of Exercise per Week:   . Minutes of Exercise per Session:   Stress:   . Feeling of Stress :   Social Connections:   . Frequency of Communication with Friends and Family:   . Frequency of Social Gatherings with Friends and Family:   . Attends Religious Services:   . Active Member of Clubs or Organizations:   . Attends Archivist Meetings:   [name] Marital Status:    Outpatient Encounter Medications as of 07/20/2019  Medication Sig  . atorvastatin (LIPITOR) 40 MG tablet Take 1 tablet by mouth daily.  . cyanocobalamin (,VITAMIN B-12,)  1000 MCG/ML injection Inject into the muscle.  [name] aspirin EC 81 MG tablet Take 81 mg by mouth daily.  . clopidogrel (PLAVIX) 75 MG tablet Take 75 mg by mouth daily.  . Continuous Blood Gluc Sensor (FREESTYLE LIBRE 14 DAY SENSOR) MISC Inject 1 each into the skin every 14 (fourteen) days. Use as directed.  . insulin aspart (NOVOLOG FLEXPEN) 100 UNIT/ML FlexPen Inject 6-9 Units into the skin 3 (three) times daily with meals.  . insulin degludec (TRESIBA FLEXTOUCH) 100 UNIT/ML FlexTouch Pen Inject 0.2 mLs (20 Units total) into the skin at bedtime.  . Insulin Pen Needle (B-D ULTRAFINE III SHORT PEN) 31G X 8 MM MISC 1 each by Does not apply route as directed.  [name] lisinopril-hydrochlorothiazide (PRINZIDE,ZESTORETIC) 20-25 MG tablet Take 1 tablet by mouth daily.  . metoprolol succinate (TOPROL-XL) 25 MG 24 hr tablet Take 25 mg by mouth daily.  . predniSONE (DELTASONE) 10 MG tablet Take 10 mg by mouth daily with breakfast.  . pyridostigmine (MESTINON) 60 MG tablet Take 30 mg by mouth 3 (three) times daily.  . [DISCONTINUED] insulin degludec (TRESIBA FLEXTOUCH) 100 UNIT/ML FlexTouch Pen Inject 0.2 mLs (20 Units total) into the skin at bedtime.  . [DISCONTINUED] NOVOLOG FLEXPEN 100 UNIT/ML FlexPen INJECT  8 UNITS INTO THE SKIN 3 (THREE) TIMES DAILY WITH MEALS.  . [DISCONTINUED] simvastatin (ZOCOR) 40 MG tablet Take 40 mg by mouth daily.   No facility-administered encounter medications on file as of 07/20/2019.   ALLERGIES: No Known Allergies VACCINATION STATUS:  There is no immunization history on file for this patient.  Diabetes He presents for his follow-up diabetic visit. He has type 1 diabetes mellitus. Onset time: He was diagnosed at approximate age of 65 years. His disease course has been fluctuating. There are no hypoglycemic associated symptoms. Pertinent negatives for hypoglycemia include no confusion, headaches, hunger, pallor, seizures or sweats. There are no diabetic associated symptoms.  Pertinent negatives for diabetes include no chest pain, no fatigue, no polydipsia, no polyphagia, no polyuria and no weakness. There are no hypoglycemic complications. Symptoms are improving. There are no diabetic complications. Risk factors for coronary artery disease include diabetes mellitus, dyslipidemia, hypertension, male sex and sedentary lifestyle. Current diabetic treatment includes insulin injections (He is currently on Tresiba 20 units nightly, NovoLog 8-14 units 3 times daily AC.  ). He is compliant with treatment most of the time. His weight is decreasing steadily. He is following a diabetic diet. When asked about meal planning, he reported none. He has not had a previous visit with a dietitian. He participates in exercise intermittently. His home blood glucose trend is decreasing steadily. His breakfast blood glucose range is generally 180-200 mg/dl. His lunch blood glucose range is generally 140-180 mg/dl. His dinner blood glucose range is generally 140-180 mg/dl. His bedtime blood glucose range is generally 180-200 mg/dl. His overall blood glucose range is 140-180 mg/dl. (He wears a CGM device.  Analysis shows time range 43%, above range 55%.  His recent previsit labs show A1c of 6.8%.) An ACE inhibitor/angiotensin II receptor blocker is being taken. He sees a podiatrist.Eye exam is current.  Hyperlipidemia This is a chronic problem. The current episode started more than 1 year ago. The problem is controlled. Exacerbating diseases include diabetes. Pertinent negatives include no chest pain, myalgias or shortness of breath. Current antihyperlipidemic treatment includes statins. Risk factors for coronary artery disease include diabetes mellitus, dyslipidemia, hypertension and a sedentary lifestyle.  Hypertension This is a chronic problem. The current episode started more than 1 year ago. The problem is controlled. Pertinent negatives include no chest pain, headaches, neck pain, palpitations,  shortness of breath or sweats. Risk factors for coronary artery disease include dyslipidemia, diabetes mellitus and male gender. Past treatments include ACE inhibitors.    Review of systems  Constitutional: + Lost weight since last visit,   current  Body mass index is 21.8 kg/m. , no fatigue, no subjective hyperthermia, no subjective hypothermia Eyes: no blurry vision, no xerophthalmia ENT: no sore throat, no nodules palpated in throat, no dysphagia/odynophagia, no hoarseness Cardiovascular: no Chest Pain, no Shortness of Breath, no palpitations, no leg swelling Respiratory: no cough, no shortness of breath Gastrointestinal: no Nausea/Vomiting/Diarhhea Musculoskeletal: no muscle/joint aches Skin: no rashes, no hyperemia Neurological: no tremors, no numbness, no tingling, no dizziness Psychiatric: no depression, no anxiety   Objective:    BP 123/72   Pulse 72   Ht 6\' 1"  (1.854 m)   Wt 165 lb 3.2 oz (74.9 kg)   BMI 21.80 kg/m   Wt Readings from Last 3 Encounters:  07/20/19 165 lb 3.2 oz (74.9 kg)  10/17/18 170 lb (77.1 kg)  05/26/18 171 lb 3.2 oz (77.7 kg)     Physical Exam- Limited  Constitutional:  Body mass  index is 21.8 kg/m. , not in acute distress, normal state of mind Eyes:  EOMI, no exophthalmos Neck: Supple Thyroid: No gross goiter Respiratory: Adequate breathing efforts Musculoskeletal: no gross deformities, strength intact in all four extremities, no gross restriction of joint movements Skin:  no rashes, no hyperemia Neurological: no tremor with outstretched hands,   Anti-islet antibodies from 11/25/2016 where negative. Recent Results (from the past 2160 hour(s))  Basic metabolic panel     Status: None   Collection Time: 04/30/19 12:00 AM  Result Value Ref Range   BUN 19 4 - 21   Creatinine 1.1 0.6 - 1.3  Lipid panel     Status: None   Collection Time: 04/30/19 12:00 AM  Result Value Ref Range   Triglycerides 159 40 - 160   Cholesterol 139 0 - 200    HDL 41 35 - 70   LDL Cholesterol 71   Hemoglobin A1c     Status: None   Collection Time: 04/30/19 12:00 AM  Result Value Ref Range   Hemoglobin A1C 6.8     Assessment & Plan:   1. diabetes mellitus with complication ([place])- Type unspecified likely type 1  - Patient has currently controlled asymptomatic diabetes diagnosed since  69 years of age.  He wears a CGM device.  Analysis shows time range 43%, above range 55%.  His recent previsit labs show A1c of 6.8%.   Patient denies any gross complications, however he remains at a high risk for more acute and chronic complications which include CAD, CVA, CKD, retinopathy, and neuropathy. These are all discussed in detail with the patient.  - I have counseled the patient on diet management by adopting a carbohydrate restricted/protein rich diet.   - he  admits there is a room for improvement in his diet and drink choices. -  Suggestion is made for him to avoid simple carbohydrates  from his diet including Cakes, Sweet Desserts / Pastries, Ice Cream, Soda (diet and regular), Sweet Tea, Candies, Chips, Cookies, Sweet Pastries,  Store Bought Juices, Alcohol in Excess of  1-2 drinks a day, Artificial Sweeteners, Coffee Creamer, and "Sugar-free" Products. This will help patient to have stable blood glucose profile and potentially avoid unintended weight gain.   - I encouraged the patient to increase his intake of more complex carbs, switch to  unprocessed or minimally processed complex starch and increased protein intake (animal or plant source), fruits, and vegetables.  - Patient is advised to stick to a routine mealtimes to eat 3 meals  a day and avoid unnecessary snacks ( to snack only to correct hypoglycemia).   - I have approached patient with the following individualized plan to manage diabetes and patient agrees:    -He will continue to require intensive treatment with basal/bolus insulin in order for him to achieve and maintain control of  diabetes to target.  -He he is utilizing his CGM, advised to continue to wear it at all times.  He is benefiting from this device.  -He will continue to need mostly one-on-one assistance with meals and insulin administration.  -He is advised to increase his [place] to 22 units nightly, lower NovoLog to 6 -9 units 3 times daily AC  for pre-meal blood glucose readings above 80 mg/dL, associated with strict monitoring of blood glucose 4 times a day- before meals and at bedtime.  - Patient is warned not to take insulin without proper monitoring per orders.  -Patient is encouraged to call clinic for blood glucose  levels less than 70 or above 200 mg /dl.  - Patient specific target  A1c;  LDL, HDL, Triglycerides,  were discussed in detail.  2) BP/HTN:   -His blood pressure is controlled to target.  He is advised to continue lisinopril/HCTZ, and metoprolol.    3) Lipids/HPL: His recent lipid panel showed controlled LDL at 75.  He is advised to continue simvastatin 40 mg p.o. nightly.    4)  Weight/Diet: His BMI is 21.8 . he is not a candidate for weight loss.   CDE Consult has been initiated , exercise, and detailed carbohydrates information provided.  5) Chronic Care/Health Maintenance:  -Patient is on ACEI/ARB and Statin medications and encouraged to continue to follow up with Ophthalmology, Podiatrist at least yearly or according to recommendations, and advised to   stay away from smoking. I have recommended yearly flu vaccine and pneumonia vaccination at least every 5 years; moderate intensity exercise for up to 150 minutes weekly; and  sleep for at least 7 hours a day.   - I advised patient to maintain close follow up with Leeanne Rio, MD for primary care needs.  - Time spent on this patient care encounter:  35 min, of which > 50% was spent in  counseling and the rest reviewing his blood glucose logs , discussing his hypoglycemia and hyperglycemia episodes, reviewing his current and   previous labs / studies  ( including abstraction from other facilities) and medications  doses and developing a  long term treatment plan and documenting his care.   Please refer to Patient Instructions for Blood Glucose Monitoring and Insulin/Medications Dosing Guide"  in media tab for additional information. Please  also refer to " Patient Self Inventory" in the Media  tab for reviewed elements of pertinent patient history.  William Beck participated in the discussions, expressed understanding, and voiced agreement with the above plans.  All questions were answered to his satisfaction. he is encouraged to contact clinic should he have any questions or concerns prior to his return visit.   Follow up plan: - Return in about 4 months (around 11/20/2019) for F/U with Pre-visit Labs, Meter, Logs, A1c here.Glade Lloyd, MD Phone: [phone]  Fax: [phone]  This note was partially dictated with voice recognition software. Similar sounding words can be transcribed inadequately or may not  be corrected upon review.  07/20/2019, 11:21 AM

## 2019-07-20 NOTE — Patient Instructions (Signed)

## 2019-07-26 ENCOUNTER — Telehealth: Payer: Self-pay | Admitting: "Endocrinology

## 2019-07-26 MED ORDER — TRESIBA FLEXTOUCH 100 UNIT/ML ~~LOC~~ SOPN: 22.0000 [IU] | PEN_INJECTOR | Freq: Every day | SUBCUTANEOUS | 2 refills | Status: DC

## 2019-07-26 NOTE — Telephone Encounter (Signed)
Pt needs refill on insulin degludec (TRESIBA FLEXTOUCH) 100 UNIT/ML FlexTouch Pen. Patient was here 5/14 and it says " fill later "

## 2019-07-26 NOTE — Telephone Encounter (Signed)
Rx refill sent.

## 2019-07-28 ENCOUNTER — Other Ambulatory Visit: Payer: Self-pay | Admitting: "Endocrinology

## 2019-07-30 NOTE — Telephone Encounter (Signed)
Called in refill for tresiba to [facility], pt made aware.

## 2019-07-30 NOTE — Telephone Encounter (Signed)
This has been sent as " Fill Later " again. Can you call this in , the patient is out. Please Advise.

## 2019-09-03 ENCOUNTER — Telehealth: Payer: Self-pay | Admitting: "Endocrinology

## 2019-09-03 NOTE — Telephone Encounter (Signed)
Pt's wife would like a call bck at [phone]

## 2019-09-03 NOTE — Telephone Encounter (Signed)
Advised pt and wife of the instructions, verbalized understanding.

## 2019-09-03 NOTE — Telephone Encounter (Signed)
Pt's wife, Santiago Glad left a VM that she can not get his sugar regulated and would like to speak with a nurse

## 2019-09-03 NOTE — Telephone Encounter (Signed)
We can catch up and give him more insulin to control hyperglycemia. I want him to feel free to eat more carbs as long as he follows the insulin program. He can increase [place] to 25 units qhs, call back if BG are > 200 x 3.

## 2019-09-03 NOTE — Telephone Encounter (Signed)
Pt's wife stated he continues to lose weight and is down to 158lbs. States she is trying to add in more carbs to help gain weight but it is causing elevated BG. Pt's BG for Friday 100 in a.m., 134 at lunch, 225 at dinner, 212 at bedtime. Saturday 160 in a.m., 110 at lunch, 202 at dinner and 199 at bedtime, Sunday 92 in a.m., 263 at lunch, 265 at dinner and 240 at bedtime. States he has been eating 2 eggs, bacon, a piece of toast, 1/2 cup of cereal, 1/2 cup milk for breakfast each day, a sandwich at lunch and cooks dinner at night. Pt uses sliding scale during the day and tresiba 22 units at bedtime.

## 2019-09-03 NOTE — Telephone Encounter (Signed)
Returned call, got voicemail, left message for return call

## 2019-09-18 ENCOUNTER — Other Ambulatory Visit: Payer: Self-pay | Admitting: "Endocrinology

## 2019-11-14 LAB — COMPLETE METABOLIC PANEL WITH GFR
AG Ratio: 1.3 (calc) (ref 1.0–2.5)
ALT: 85 U/L — ABNORMAL HIGH (ref 9–46)
AST: 62 U/L — ABNORMAL HIGH (ref 10–35)
Albumin: 3.9 g/dL (ref 3.6–5.1)
Alkaline phosphatase (APISO): 309 U/L — ABNORMAL HIGH (ref 35–144)
BUN: 20 mg/dL (ref 7–25)
CO2: 30 mmol/L (ref 20–32)
Calcium: 9.7 mg/dL (ref 8.6–10.3)
Chloride: 103 mmol/L (ref 98–110)
Creat: 1.04 mg/dL (ref 0.70–1.25)
GFR, Est African American: 85 mL/min/{1.73_m2} (ref >=60)
GFR, Est Non African American: 73 mL/min/{1.73_m2} (ref >=60)
Globulin: 2.9 g/dL (calc) (ref 1.9–3.7)
Glucose, Bld: 144 mg/dL — ABNORMAL HIGH (ref 65–99)
Potassium: 4 mmol/L (ref 3.5–5.3)
Sodium: 140 mmol/L (ref 135–146)
Total Bilirubin: 1.3 mg/dL — ABNORMAL HIGH (ref 0.2–1.2)
Total Protein: 6.8 g/dL (ref 6.1–8.1)

## 2019-11-14 LAB — LIPID PANEL
Cholesterol: 177 mg/dL (ref <200)
HDL: 73 mg/dL (ref >=40)
LDL Cholesterol (Calc): 84 mg/dL (calc)
Non-HDL Cholesterol (Calc): 104 mg/dL (calc) (ref <130)
Total CHOL/HDL Ratio: 2.4 (calc) (ref <5.0)
Triglycerides: 107 mg/dL (ref <150)

## 2019-11-14 LAB — TSH: TSH: 4.7 mIU/L — ABNORMAL HIGH (ref 0.40–4.50)

## 2019-11-14 LAB — T4, FREE: Free T4: 1.2 ng/dL (ref 0.8–1.8)

## 2019-11-22 ENCOUNTER — Encounter: Payer: Self-pay | Admitting: Nurse Practitioner

## 2019-11-22 ENCOUNTER — Other Ambulatory Visit: Payer: Self-pay

## 2019-11-22 ENCOUNTER — Ambulatory Visit (INDEPENDENT_AMBULATORY_CARE_PROVIDER_SITE_OTHER): Payer: Medicare Other | Admitting: Nurse Practitioner

## 2019-11-22 VITALS — BP 110/64 | HR 62 | Ht 73.0 in | Wt 159.0 lb

## 2019-11-22 DIAGNOSIS — E1065 Type 1 diabetes mellitus with hyperglycemia: Secondary | ICD-10-CM | POA: Diagnosis not present

## 2019-11-22 DIAGNOSIS — IMO0002 Reserved for concepts with insufficient information to code with codable children: Secondary | ICD-10-CM

## 2019-11-22 DIAGNOSIS — I10 Essential (primary) hypertension: Secondary | ICD-10-CM

## 2019-11-22 DIAGNOSIS — E108 Type 1 diabetes mellitus with unspecified complications: Secondary | ICD-10-CM | POA: Diagnosis not present

## 2019-11-22 DIAGNOSIS — E782 Mixed hyperlipidemia: Secondary | ICD-10-CM | POA: Diagnosis not present

## 2019-11-22 LAB — POCT GLYCOSYLATED HEMOGLOBIN (HGB A1C): Hemoglobin A1C: 6.9 % — AB (ref 4.0–5.6)

## 2019-11-22 NOTE — Patient Instructions (Signed)

## 2019-11-22 NOTE — Progress Notes (Signed)
11/22/2019  Endocrinology Follow-up Note   Subjective:    Patient ID: William Beck, male    DOB: 1950-12-06.  He is being seen in follow-up for the management of his type 1 diabetes, hyperlipidemia, hypertension.    PMD:  Leeanne Rio, MD  Past Medical History:  Diagnosis Date  . Diabetes mellitus type I ([place])   . Hyperlipidemia   . Hypertension    Past Surgical History:  Procedure Laterality Date  . BACK SURGERY     Social History   Socioeconomic History  . Marital status: Widowed    Spouse name: Not on file  . Number of children: Not on file  . Years of education: Not on file  . Highest education level: Not on file  Occupational History  . Not on file  Tobacco Use  . Smoking status: Never Smoker  . Smokeless tobacco: Never Used  Vaping Use  . Vaping Use: Never used  Substance and Sexual Activity  . Alcohol use: No  . Drug use: No  . Sexual activity: Not on file  Other Topics Concern  . Not on file  Social History Narrative  . Not on file   Social Determinants of Health   Financial Resource Strain:   . Difficulty of Paying Living Expenses: Not on file  Food Insecurity:   . Worried About Charity fundraiser in the Last Year: Not on file  . Ran Out of Food in the Last Year: Not on file  Transportation Needs:   . Lack of Transportation (Medical): Not on file  . Lack of Transportation (Non-Medical): Not on file  Physical Activity:   . Days of Exercise per Week: Not on file  . Minutes of Exercise per Session: Not on file  Stress:   . Feeling of Stress : Not on file  Social Connections:   . Frequency of Communication with Friends and Family: Not on file  . Frequency of Social Gatherings with Friends and Family: Not on file  . Attends Religious Services: Not on file  . Active Member of Clubs or Organizations: Not on file  . Attends Archivist Meetings: Not on file  . Marital Status: Not on file   Outpatient Encounter Medications as of  11/22/2019  Medication Sig  . aspirin EC 81 MG tablet Take 81 mg by mouth daily.  [name] atorvastatin (LIPITOR) 40 MG tablet Take 1 tablet by mouth daily.  . clopidogrel (PLAVIX) 75 MG tablet Take 75 mg by mouth daily.  . Continuous Blood Gluc Sensor (FREESTYLE LIBRE 14 DAY SENSOR) MISC Inject 1 each into the skin every 14 (fourteen) days. Use as directed.  . cyanocobalamin (,VITAMIN B-12,) 1000 MCG/ML injection Inject into the muscle.  . insulin aspart (NOVOLOG FLEXPEN) 100 UNIT/ML FlexPen Inject 6-9 Units into the skin 3 (three) times daily with meals.  . insulin degludec (TRESIBA FLEXTOUCH) 100 UNIT/ML FlexTouch Pen Inject 0.22 mLs (22 Units total) into the skin at bedtime.  . Insulin Pen Needle (B-D ULTRAFINE III SHORT PEN) 31G X 8 MM MISC 1 each by Does not apply route as directed.  . metoprolol succinate (TOPROL-XL) 25 MG 24 hr tablet Take 25 mg by mouth daily.  . montelukast (SINGULAIR) 10 MG tablet montelukast 10 mg tablet  TAKE 1 TABLET BY MOUTH EVERY DAY AT NIGHT  . Multiple Vitamin (MULTIVITAMIN) capsule Take 1 capsule by mouth daily.  . predniSONE (DELTASONE) 10 MG tablet Take 10 mg by mouth daily with breakfast.  .  pyridostigmine (MESTINON) 60 MG tablet Take 30 mg by mouth 3 (three) times daily.  . [DISCONTINUED] lisinopril-hydrochlorothiazide (PRINZIDE,ZESTORETIC) 20-25 MG tablet Take 1 tablet by mouth daily.   No facility-administered encounter medications on file as of 11/22/2019.   ALLERGIES: No Known Allergies VACCINATION STATUS:  There is no immunization history on file for this patient.  Diabetes He presents for his follow-up diabetic visit. He has type 1 diabetes mellitus. Onset time: He was diagnosed at approximate age of 69 years. His disease course has been improving. There are no hypoglycemic associated symptoms. Pertinent negatives for hypoglycemia include no confusion, headaches, hunger, pallor, seizures or sweats. There are no diabetic associated symptoms. Pertinent  negatives for diabetes include no chest pain, no fatigue, no polydipsia, no polyphagia, no polyuria and no weakness. There are no hypoglycemic complications. Symptoms are improving. There are no diabetic complications. Risk factors for coronary artery disease include diabetes mellitus, dyslipidemia, hypertension, male sex and sedentary lifestyle. Current diabetic treatment includes insulin injections. He is compliant with treatment most of the time. His weight is decreasing steadily. He is following a diabetic diet. When asked about meal planning, he reported none. He has not had a previous visit with a dietitian. He participates in exercise intermittently. His home blood glucose trend is decreasing steadily. (He presents today, accompanied by his wife, with his CGM showing at target glycemic profile.  His TIR is 65%.  His POCT A1C today is 6.9%, essentially unchanged from last visit of 6.8%.  He does report some rare fasting hypoglycemia, usually associated with dinner meal volume.  He and his wife are also concerned about his inability to gain weight.) An ACE inhibitor/angiotensin II receptor blocker is being taken. He sees a podiatrist.Eye exam is current.  Hyperlipidemia This is a chronic problem. The current episode started more than 1 year ago. The problem is controlled. Recent lipid tests were reviewed and are normal. Exacerbating diseases include chronic renal disease and diabetes. Factors aggravating his hyperlipidemia include fatty foods. Pertinent negatives include no chest pain, myalgias or shortness of breath. Current antihyperlipidemic treatment includes statins. The current treatment provides moderate improvement of lipids. Compliance problems include adherence to diet and adherence to exercise.  Risk factors for coronary artery disease include diabetes mellitus, dyslipidemia, hypertension, a sedentary lifestyle and male sex.  Hypertension This is a chronic problem. The current episode started  more than 1 year ago. The problem has been gradually improving since onset. The problem is controlled. Pertinent negatives include no chest pain, headaches, neck pain, palpitations, shortness of breath or sweats. There are no associated agents to hypertension. Risk factors for coronary artery disease include dyslipidemia, diabetes mellitus and male gender. Past treatments include ACE inhibitors and diuretics. The current treatment provides moderate improvement. There are no compliance problems.  Identifiable causes of hypertension include chronic renal disease.    Review of systems  Constitutional: + Minimally fluctuating body weight, current Body mass index is 20.98 kg/m. , no fatigue, no subjective hyperthermia, no subjective hypothermia Eyes: no blurry vision, no xerophthalmia ENT: no sore throat, no nodules palpated in throat, no dysphagia/odynophagia, no hoarseness Cardiovascular: no chest pain, no shortness of breath, no palpitations, no leg swelling Respiratory: no cough, no shortness of breath Gastrointestinal: no nausea/vomiting/diarrhea Musculoskeletal: no muscle/joint aches Skin: no rashes, no hyperemia Neurological: no tremors, no numbness, no tingling, no dizziness Psychiatric: no depression, no anxiety    Objective:    BP 110/64 (BP Location: Right Arm, Patient Position: Sitting)   Pulse 62  Ht 6\' 1"  (1.854 m)   Wt 159 lb (72.1 kg)   BMI 20.98 kg/m   Wt Readings from Last 3 Encounters:  11/22/19 159 lb (72.1 kg)  07/20/19 165 lb 3.2 oz (74.9 kg)  10/17/18 170 lb (77.1 kg)    BP Readings from Last 3 Encounters:  11/22/19 110/64  07/20/19 123/72  10/17/18 101/72   Physical Exam- Limited  Constitutional:  Body mass index is 20.98 kg/m. , not in acute distress, normal state of mind Eyes:  EOMI, no exophthalmos Neck: Supple Thyroid: No gross goiter Respiratory: Adequate breathing efforts Musculoskeletal: no gross deformities, strength intact in all four  extremities, no gross restriction of joint movements Skin:  no rashes, no hyperemia Neurological: no tremor with outstretched hands,     Anti-islet antibodies from 11/25/2016 where negative. Recent Results (from the past 2160 hour(s))  COMPLETE METABOLIC PANEL WITH GFR     Status: Abnormal   Collection Time: 11/13/19  7:38 AM  Result Value Ref Range   Glucose, Bld 144 (H) 65 - 99 mg/dL    Comment: .            Fasting reference interval . For someone without known diabetes, a glucose value >125 mg/dL indicates that they may have diabetes and this should be confirmed with a follow-up test. .    BUN 20 7 - 25 mg/dL   Creat 1.04 0.70 - 1.25 mg/dL    Comment: For patients >63 years of age, the reference limit for Creatinine is approximately 13% higher for people identified as African-American. .    GFR, Est Non African American 73 > OR = 60 mL/min/1.102m2   GFR, Est African American 85 > OR = 60 mL/min/1.42m2   BUN/Creatinine Ratio NOT APPLICABLE 6 - 22 (calc)   Sodium 140 135 - 146 mmol/L   Potassium 4.0 3.5 - 5.3 mmol/L   Chloride 103 98 - 110 mmol/L   CO2 30 20 - 32 mmol/L   Calcium 9.7 8.6 - 10.3 mg/dL   Total Protein 6.8 6.1 - 8.1 g/dL   Albumin 3.9 3.6 - 5.1 g/dL   Globulin 2.9 1.9 - 3.7 g/dL (calc)   AG Ratio 1.3 1.0 - 2.5 (calc)   Total Bilirubin 1.3 (H) 0.2 - 1.2 mg/dL   Alkaline phosphatase (APISO) 309 (H) 35 - 144 U/L   AST 62 (H) 10 - 35 U/L   ALT 85 (H) 9 - 46 U/L  T4, free     Status: None   Collection Time: 11/13/19  7:38 AM  Result Value Ref Range   Free T4 1.2 0.8 - 1.8 ng/dL  TSH     Status: Abnormal   Collection Time: 11/13/19  7:38 AM  Result Value Ref Range   TSH 4.70 (H) 0.40 - 4.50 mIU/L  Lipid panel     Status: None   Collection Time: 11/13/19  7:38 AM  Result Value Ref Range   Cholesterol 177 <200 mg/dL   HDL 73 > OR = 40 mg/dL   Triglycerides 107 <150 mg/dL   LDL Cholesterol (Calc) 84 mg/dL (calc)    Comment: Reference range:  <100 . Desirable range <100 mg/dL for primary prevention;   <70 mg/dL for patients with CHD or diabetic patients  with > or = 2 CHD risk factors. [name] LDL-C is now calculated using the Martin-Hopkins  calculation, which is a validated novel method providing  better accuracy than the Friedewald equation in the  estimation of LDL-C.  Hassell Done  SS et al. JAMA. 9211;941(74): 2061-2068  (http://education.[facility].com/faq/FAQ164)    Total CHOL/HDL Ratio 2.4 <5.0 (calc)   Non-HDL Cholesterol (Calc) 104 <130 mg/dL (calc)    Comment: For patients with diabetes plus 1 major ASCVD risk  factor, treating to a non-HDL-C goal of <100 mg/dL  (LDL-C of <70 mg/dL) is considered a therapeutic  option.   HgB A1c     Status: Abnormal   Collection Time: 11/22/19 11:40 AM  Result Value Ref Range   Hemoglobin A1C 6.9 (A) 4.0 - 5.6 %   HbA1c POC (<> result, manual entry)     HbA1c, POC (prediabetic range)     HbA1c, POC (controlled diabetic range)      Assessment & Plan:   1. diabetes mellitus with complication ([place])- Type unspecified likely type 1  - Patient has currently controlled asymptomatic diabetes diagnosed since  69 years of age.  He presents today, accompanied by his wife, with his CGM showing at target glycemic profile.  His TIR is 65%.  His POCT A1C today is 6.9%, essentially unchanged from last visit of 6.8%.  He does report some rare fasting hypoglycemia, usually associated with dinner meal volume.  He and his wife are also concerned about his inability to gain weight.   Patient denies any gross complications, however he remains at a high risk for more acute and chronic complications which include CAD, CVA, CKD, retinopathy, and neuropathy. These are all discussed in detail with the patient.  - I have counseled the patient on diet management by adopting a carbohydrate restricted/protein rich diet.  - The patient admits there is a room for improvement in their diet and drink  choices. -  Suggestion is made for the patient to avoid simple carbohydrates from their diet including Cakes, Sweet Desserts / Pastries, Ice Cream, Soda (diet and regular), Sweet Tea, Candies, Chips, Cookies, Sweet Pastries,  Store Bought Juices, Alcohol in Excess of  1-2 drinks a day, Artificial Sweeteners, Coffee Creamer, and "Sugar-free" Products. This will help patient to have stable blood glucose profile and potentially avoid unintended weight gain.   - I encouraged the patient to switch to  unprocessed or minimally processed complex starch and increased protein intake (animal or plant source), fruits, and vegetables.   - Patient is advised to stick to a routine mealtimes to eat 3 meals  a day and avoid unnecessary snacks ( to snack only to correct hypoglycemia).  - I have approached patient with the following individualized plan to manage diabetes and patient agrees:   -He will continue to require intensive treatment with basal/bolus insulin in order for him to achieve and maintain control of diabetes to target.  -He he is utilizing his CGM, advised to continue to wear it at all times.  He is benefiting from this device.  -He will continue to need mostly one-on-one assistance with meals and insulin administration.  -He is advised to continue his Tresiba 25 units nightly, and continue NovoLog to 8 -11 units 3 times daily AC for pre-meal blood glucose readings above 70 and he is eating.  -He is advised to continue using his CGM to monitor glucose 4 times per day, before meals and at bedtime and call the clinic if blood glucose is below 70 or greater than 200 for 3 tests in a row.  -His wife is strict with his diet and will limit carbs to prevent glucose spikes as he worries about his blood glucose being too high.  They are instructed to  add more carbs to each meal, which will assist in slow, steady weight gain.  - Patient is warned not to take insulin without proper monitoring per orders.  -  Patient specific target  A1c;  LDL, HDL, Triglycerides,  were discussed in detail.  2) BP/HTN:  His blood pressure is controlled to target.  He is advised to continue Lisinopril-HCTZ 20-25 mg po daily and metoprolol 25 mg po daily.  3) Lipids/HPL:  His recent lipid panel from 11/13/19 shows controlled LDL at 84.  He is advised to continue Lipitor 40 mg po daily at bedtime.  Side effects and precautions discussed with him.  4)  Weight/Diet:  His Body mass index is 20.98 kg/m.- not a candidate for weight loss.   CDE Consult has been initiated , exercise, and detailed carbohydrates information provided.  5) Chronic Care/Health Maintenance: -Patient is on ACEI/ARB and Statin medications and encouraged to continue to follow up with Ophthalmology, Podiatrist at least yearly or according to recommendations, and advised to   stay away from smoking. I have recommended yearly flu vaccine and pneumonia vaccination at least every 5 years; moderate intensity exercise for up to 150 minutes weekly; and  sleep for at least 7 hours a day.   - I advised patient to maintain close follow up with Leeanne Rio, MD for primary care needs.  - Time spent on this patient care encounter:  35 min, of which > 50% was spent in  counseling and the rest reviewing his blood glucose logs , discussing his hypoglycemia and hyperglycemia episodes, reviewing his current and  previous labs / studies  ( including abstraction from other facilities) and medications  doses and developing a  long term treatment plan and documenting his care.   Please refer to Patient Instructions for Blood Glucose Monitoring and Insulin/Medications Dosing Guide"  in media tab for additional information. Please  also refer to " Patient Self Inventory" in the Media  tab for reviewed elements of pertinent patient history.  Heron Nay participated in the discussions, expressed understanding, and voiced agreement with the above plans.  All questions  were answered to his satisfaction. he is encouraged to contact clinic should he have any questions or concerns prior to his return visit.  Follow up plan: - Return in about 4 months (around 03/23/2020) for Diabetes follow up, Previsit labs, Virtual visit ok.  Rayetta Pigg, FNP-BC [facility] Phone: [phone] Fax: [phone]  11/22/2019, 12:10 PM

## 2019-11-28 ENCOUNTER — Telehealth: Payer: Self-pay

## 2019-11-28 NOTE — Telephone Encounter (Signed)
Patient seen Whitney last week and was advised to call if sugar was over 200 3x in a row. Its been that and actually been in the 300's  [phone]

## 2019-11-29 NOTE — Telephone Encounter (Signed)
William Beck,  I expected to see this. He desperately needed to gain some weight which is why I advised him to eat more carbs.  We can manage those high blood sugar readings.  Lets increase his Tyler Aas to 30 units SQ nightly and get a bit more aggressive with the sliding scale.  Currently he is doing Novolog 8-11 units.  I want him to do 8-14 units depending on his readings.  151-200: 9 units 201-250: 10 units 251-300: 11 units 301-350: 12 units 351-400: 13 units 400+: 14 units  Tell him not to stress too much, it is a process to find the best plan that will work for him and it may take a few more adjustments to get it right.

## 2019-11-29 NOTE — Telephone Encounter (Signed)
Returned call to patient,BG readings are as follows on 9/21 262 am, 345 lunch, 305 dinner, 241 hs, 9/22 74 am, 194 lunch, 213 dinner, 104 hs, 9/23 172 am. Patient stated that he was eating some extra carbs as advised on 9/21, reverted back to eating as previous cutting out the extra carbs on 9/22. Please advise

## 2019-11-29 NOTE — Telephone Encounter (Signed)
Returned call to patient , verbalized understanding to new sliding scales.

## 2019-12-26 ENCOUNTER — Other Ambulatory Visit: Payer: Self-pay | Admitting: "Endocrinology

## 2020-01-21 ENCOUNTER — Other Ambulatory Visit: Payer: Self-pay

## 2020-01-21 ENCOUNTER — Telehealth: Payer: Self-pay | Admitting: "Endocrinology

## 2020-01-21 DIAGNOSIS — IMO0002 Reserved for concepts with insufficient information to code with codable children: Secondary | ICD-10-CM

## 2020-01-21 DIAGNOSIS — E1065 Type 1 diabetes mellitus with hyperglycemia: Secondary | ICD-10-CM

## 2020-01-21 MED ORDER — TRESIBA FLEXTOUCH 100 UNIT/ML ~~LOC~~ SOPN: 30.0000 [IU] | PEN_INJECTOR | Freq: Every day | SUBCUTANEOUS | 1 refills | Status: DC

## 2020-01-21 NOTE — Telephone Encounter (Signed)
Pt wife is calling and states she has been trying to get his insulin degludec (TRESIBA FLEXTOUCH) 100 UNIT/ML FlexTouch Pen Refilled for 2 weeks and that the pharmacy keeps saying the Dr is not responding. She states they refilled the Novalog but not this. Patient only has enough [place] for tonight and then he will be out. Please advise  [id number] - [place], [place] Phone:  [phone]  Fax:  [phone]

## 2020-01-21 NOTE — Telephone Encounter (Signed)
Sent in

## 2020-02-19 ENCOUNTER — Other Ambulatory Visit: Payer: Self-pay | Admitting: Nurse Practitioner

## 2020-03-09 ENCOUNTER — Other Ambulatory Visit: Payer: Self-pay | Admitting: "Endocrinology

## 2020-03-09 DIAGNOSIS — IMO0002 Reserved for concepts with insufficient information to code with codable children: Secondary | ICD-10-CM

## 2020-03-09 DIAGNOSIS — E1065 Type 1 diabetes mellitus with hyperglycemia: Secondary | ICD-10-CM

## 2020-03-25 ENCOUNTER — Telehealth: Payer: Medicare Other | Admitting: Nurse Practitioner

## 2020-03-28 LAB — MICROALBUMIN / CREATININE URINE RATIO
Creatinine, Urine: 96.9 mg/dL
Microalb/Creat Ratio: <3 mg/g creat (ref 0–29)
Microalbumin, Urine: <3 ug/mL

## 2020-03-28 LAB — HEMOGLOBIN A1C
Est. average glucose Bld gHb Est-mCnc: 148 mg/dL
Hgb A1c MFr Bld: 6.8 % — ABNORMAL HIGH (ref 4.8–5.6)

## 2020-04-03 ENCOUNTER — Other Ambulatory Visit: Payer: Self-pay

## 2020-04-03 ENCOUNTER — Ambulatory Visit (INDEPENDENT_AMBULATORY_CARE_PROVIDER_SITE_OTHER): Payer: Medicare Other | Admitting: Nurse Practitioner

## 2020-04-03 ENCOUNTER — Encounter: Payer: Self-pay | Admitting: Nurse Practitioner

## 2020-04-03 VITALS — BP 143/64 | HR 66 | Ht 73.0 in | Wt 161.6 lb

## 2020-04-03 DIAGNOSIS — E108 Type 1 diabetes mellitus with unspecified complications: Secondary | ICD-10-CM

## 2020-04-03 DIAGNOSIS — E782 Mixed hyperlipidemia: Secondary | ICD-10-CM | POA: Diagnosis not present

## 2020-04-03 DIAGNOSIS — E1065 Type 1 diabetes mellitus with hyperglycemia: Secondary | ICD-10-CM

## 2020-04-03 DIAGNOSIS — I10 Essential (primary) hypertension: Secondary | ICD-10-CM | POA: Diagnosis not present

## 2020-04-03 DIAGNOSIS — IMO0002 Reserved for concepts with insufficient information to code with codable children: Secondary | ICD-10-CM

## 2020-04-03 MED ORDER — TRESIBA FLEXTOUCH 100 UNIT/ML ~~LOC~~ SOPN
28.0000 [IU] | PEN_INJECTOR | Freq: Every day | SUBCUTANEOUS | 1 refills | Status: DC
Start: 2020-04-03 — End: 2020-08-08

## 2020-04-03 NOTE — Progress Notes (Signed)
04/03/2020  Endocrinology Follow-up Note   Subjective:    Patient ID: William Beck, male    DOB: 1950-09-03.  He is being seen in follow-up for the management of his type 1 diabetes, hyperlipidemia, hypertension.    PMD:  Leeanne Rio, MD  Past Medical History:  Diagnosis Date  . Diabetes mellitus type I ([place])   . Hyperlipidemia   . Hypertension    Past Surgical History:  Procedure Laterality Date  . BACK SURGERY     Social History   Socioeconomic History  . Marital status: Widowed    Spouse name: Not on file  . Number of children: Not on file  . Years of education: Not on file  . Highest education level: Not on file  Occupational History  . Not on file  Tobacco Use  . Smoking status: Never Smoker  . Smokeless tobacco: Never Used  Vaping Use  . Vaping Use: Never used  Substance and Sexual Activity  . Alcohol use: No  . Drug use: No  . Sexual activity: Not on file  Other Topics Concern  . Not on file  Social History Narrative  . Not on file   Social Determinants of Health   Financial Resource Strain: Not on file  Food Insecurity: Not on file  Transportation Needs: Not on file  Physical Activity: Not on file  Stress: Not on file  Social Connections: Not on file   Outpatient Encounter Medications as of 04/03/2020  Medication Sig  . aspirin EC 81 MG tablet Take 81 mg by mouth daily.  [name] atorvastatin (LIPITOR) 40 MG tablet Take 1 tablet by mouth daily.  . clopidogrel (PLAVIX) 75 MG tablet Take 75 mg by mouth daily.  . Continuous Blood Gluc Sensor (FREESTYLE LIBRE 14 DAY SENSOR) MISC Inject 1 each into the skin every 14 (fourteen) days. Use as directed.  . Insulin Pen Needle (B-D ULTRAFINE III SHORT PEN) 31G X 8 MM MISC 1 each by Does not apply route as directed.  . metoprolol succinate (TOPROL-XL) 25 MG 24 hr tablet Take 25 mg by mouth daily.  . montelukast (SINGULAIR) 10 MG tablet montelukast 10 mg tablet  TAKE 1 TABLET BY MOUTH EVERY DAY AT NIGHT  .  Multiple Vitamin (MULTIVITAMIN) capsule Take 1 capsule by mouth daily.  [name] NOVOLOG FLEXPEN 100 UNIT/ML FlexPen INJECT 8-14 UNITS INTO THE SKIN 3 (THREE) TIMES DAILY WITH MEALS.  [name] predniSONE (DELTASONE) 10 MG tablet Take 10 mg by mouth daily with breakfast.  . pyridostigmine (MESTINON) 60 MG tablet Take 30 mg by mouth 3 (three) times daily.  . [DISCONTINUED] TRESIBA FLEXTOUCH 100 UNIT/ML FlexTouch Pen INJECT 30 UNITS INTO THE SKIN AT BEDTIME.  . insulin degludec (TRESIBA FLEXTOUCH) 100 UNIT/ML FlexTouch Pen Inject 28 Units into the skin at bedtime.  . [DISCONTINUED] cyanocobalamin (,VITAMIN B-12,) 1000 MCG/ML injection Inject into the muscle.   No facility-administered encounter medications on file as of 04/03/2020.   ALLERGIES: No Known Allergies VACCINATION STATUS: Immunization History  Administered Date(s) Administered  . Moderna Sars-Covid-2 Vaccination 02/11/2020    Diabetes He presents for his follow-up diabetic visit. He has type 1 diabetes mellitus. Onset time: He was diagnosed at approximate age of 81 years. His disease course has been improving. There are no hypoglycemic associated symptoms. Pertinent negatives for hypoglycemia include no confusion, headaches, hunger, pallor, seizures or sweats. There are no diabetic associated symptoms. Pertinent negatives for diabetes include no chest pain, no fatigue, no polydipsia, no polyphagia, no polyuria and  no weakness. Hypoglycemia complications include nocturnal hypoglycemia. (He has no precipitating symptoms, his wife notices it) Symptoms are improving. There are no diabetic complications. Risk factors for coronary artery disease include diabetes mellitus, dyslipidemia, hypertension, male sex and sedentary lifestyle. Current diabetic treatment includes insulin injections. He is compliant with treatment most of the time. His weight is increasing steadily. He is following a diabetic diet. When asked about meal planning, he reported none. He has  not had a previous visit with a dietitian. He participates in exercise intermittently. His home blood glucose trend is decreasing steadily. His breakfast blood glucose range is generally 90-110 mg/dl. His lunch blood glucose range is generally 90-110 mg/dl. His dinner blood glucose range is generally 110-130 mg/dl. His bedtime blood glucose range is generally 130-140 mg/dl. (He presents today, accompanied by his wife, with his CGM and logs showing tight fasting and near target postprandial glycemic profile overall.  His previsit A1c was 6.8%, essentially unchanged from last visit of 6.9% but still a good goal for him.  Analysis of his CGM shows TIR 54%, TAR 34%, TBR 12%.  He has gained some weight since last visit (which is an improvement for him).) An ACE inhibitor/angiotensin II receptor blocker is being taken. He sees a podiatrist.Eye exam is current.  Hyperlipidemia This is a chronic problem. The current episode started more than 1 year ago. The problem is controlled. Recent lipid tests were reviewed and are normal. Exacerbating diseases include chronic renal disease and diabetes. Factors aggravating his hyperlipidemia include fatty foods. Pertinent negatives include no chest pain, myalgias or shortness of breath. Current antihyperlipidemic treatment includes statins. The current treatment provides moderate improvement of lipids. Compliance problems include adherence to diet and adherence to exercise.  Risk factors for coronary artery disease include diabetes mellitus, dyslipidemia, hypertension, a sedentary lifestyle and male sex.  Hypertension This is a chronic problem. The current episode started more than 1 year ago. The problem has been gradually improving since onset. The problem is uncontrolled. Pertinent negatives include no chest pain, headaches, neck pain, palpitations, shortness of breath or sweats. There are no associated agents to hypertension. Risk factors for coronary artery disease include  dyslipidemia, diabetes mellitus and male gender. Past treatments include beta blockers. The current treatment provides moderate improvement. There are no compliance problems.  Identifiable causes of hypertension include chronic renal disease.    Review of systems  Constitutional: + steadily increasing body weight, current Body mass index is 21.32 kg/m. , no fatigue, no subjective hyperthermia, no subjective hypothermia Eyes: no blurry vision, no xerophthalmia ENT: no sore throat, no nodules palpated in throat, no dysphagia/odynophagia, no hoarseness Cardiovascular: no chest pain, no shortness of breath, no palpitations, no leg swelling Respiratory: no cough, no shortness of breath Gastrointestinal: no nausea/vomiting/diarrhea Musculoskeletal: no muscle/joint aches, + right foot pain (wears compressive brace) Skin: no rashes, no hyperemia Neurological: no tremors, no numbness, no tingling, no dizziness Psychiatric: no depression, no anxiety    Objective:    BP (!) 143/64 (BP Location: Left Arm)   Pulse 66   Ht 6\' 1"  (1.854 m)   Wt 161 lb 9.6 oz (73.3 kg)   BMI 21.32 kg/m   Wt Readings from Last 3 Encounters:  04/03/20 161 lb 9.6 oz (73.3 kg)  11/22/19 159 lb (72.1 kg)  07/20/19 165 lb 3.2 oz (74.9 kg)    BP Readings from Last 3 Encounters:  04/03/20 (!) 143/64  11/22/19 110/64  07/20/19 123/72   Physical Exam- Limited  Constitutional:  Body mass index is 21.32 kg/m. , not in acute distress, normal state of mind Eyes:  EOMI, no exophthalmos Neck: Supple Respiratory: Adequate breathing efforts Musculoskeletal: right foot deformity (from previous fracture), strength intact in all four extremities, no gross restriction of joint movements Skin:  no rashes, no hyperemia Neurological: no tremor with outstretched hands,   Foot exam:   No rashes, ulcers, cuts, calluses, mild onychodystrophy R>L   Good pulses bilat.  Good sensation to 10 g monofilament bilat.  Has small  blanchable pressure area on Left lateral dorsal foot.  Foot deformity from previous fracture which never healed correctly.   POCT ABI Results 04/03/20   Right ABI:  1.25      Left ABI:  1.25  Right leg systolic / diastolic: 497/02 mmHg Left leg systolic / diastolic: 637/85 mmHg  Arm systolic / diastolic: 885/02 mmHG  Detailed report will be scanned into patient chart.   Anti-islet antibodies from 11/25/2016 where negative. Recent Results (from the past 2160 hour(s))  Hemoglobin A1c     Status: Abnormal   Collection Time: 03/27/20  8:29 AM  Result Value Ref Range   Hgb A1c MFr Bld 6.8 (H) 4.8 - 5.6 %    Comment:          Prediabetes: 5.7 - 6.4          Diabetes: >6.4          Glycemic control for adults with diabetes: <7.0    Est. average glucose Bld gHb Est-mCnc 148 mg/dL  Microalbumin / creatinine urine ratio     Status: None   Collection Time: 03/27/20  8:29 AM  Result Value Ref Range   Creatinine, Urine 96.9 Not Estab. mg/dL   Microalbumin, Urine <3.0 Not Estab. ug/mL   Microalb/Creat Ratio <3 0 - 29 mg/g creat    Comment:                        Normal:                0 -  29                        Moderately increased: 30 - 300                        Severely increased:       >300     Assessment & Plan:   1) Diabetes mellitus with complication (HCC)- Type unspecified- likely type 1  - Patient has currently controlled asymptomatic diabetes diagnosed since 70 years of age.  He presents today, accompanied by his wife, with his CGM and logs showing tight fasting and near target postprandial glycemic profile overall.  His previsit A1c was 6.8%, essentially unchanged from last visit of 6.9% but still a good goal for him.  Analysis of his CGM shows TIR 54%, TAR 34%, TBR 12%.  He has gained some weight since last visit (which is an improvement for him).   Patient denies any gross complications, however he remains at a high risk for more acute and chronic complications which  include CAD, CVA, CKD, retinopathy, and neuropathy. These are all discussed in detail with the patient.  - Nutritional counseling repeated at each appointment due to patients tendency to fall back in to old habits.  - The patient admits there is a room for improvement in their diet and drink choices. -  Suggestion is made for the patient to avoid simple carbohydrates from their diet including Cakes, Sweet Desserts / Pastries, Ice Cream, Soda (diet and regular), Sweet Tea, Candies, Chips, Cookies, Sweet Pastries,  Store Bought Juices, Alcohol in Excess of  1-2 drinks a day, Artificial Sweeteners, Coffee Creamer, and "Sugar-free" Products. This will help patient to have stable blood glucose profile and potentially avoid unintended weight gain.   - I encouraged the patient to switch to  unprocessed or minimally processed complex starch and increased protein intake (animal or plant source), fruits, and vegetables.   - Patient is advised to stick to a routine mealtimes to eat 3 meals  a day and avoid unnecessary snacks ( to snack only to correct hypoglycemia).  - I have approached patient with the following individualized plan to manage diabetes and patient agrees:   -He will continue to require intensive treatment with basal/bolus insulin in order for him to achieve and maintain control of diabetes to target.  He will continue to need mostly one-on-one assistance with meals and insulin administration.   -Given his tight fasting glycemic profile, he is advised to lower his Tresiba to 28 units SQ daily at bedtime.  He can continue current dose of Novolog at 8-14 units TID with meals if glucose is above 90 and he is eating.  Specific instructions on how to titrate insulin dose based on glucose readings given to patient in writing.   -He is advised to continue using his CGM to monitor glucose 4 times per day, before meals and at bedtime and call the clinic if blood glucose is below 70 or greater than 200  for 3 tests in a row.  -His wife is strict with his diet and will limit carbs to prevent glucose spikes as he worries about his blood glucose being too high.  They are instructed to add more carbs to each meal, which will assist in slow, steady weight gain.  - Patient is warned not to take insulin without proper monitoring per orders.  - Patient specific target  A1c;  LDL, HDL, Triglycerides,  were discussed in detail.  2) BP/HTN:  His blood pressure is not controlled to target.  His Lisinopril-HCT was discontinued by his PCP since last visit. He is advised to continue Metoprolol 25 mg po daily.  3) Lipids/HPL:  His recent lipid panel from 11/13/19 shows controlled LDL at 84.  He is advised to continue Lipitor 40 mg po daily at bedtime.  Side effects and precautions discussed with him.  4)  Weight/Diet:  His Body mass index is 21.32 kg/m.- not a candidate for weight loss.   CDE Consult has been initiated , exercise, and detailed carbohydrates information provided.  5) Chronic Care/Health Maintenance: -Patient is on Statin medications and encouraged to continue to follow up with Ophthalmology, Podiatrist at least yearly or according to recommendations, and advised to   stay away from smoking. I have recommended yearly flu vaccine and pneumonia vaccination at least every 5 years; moderate intensity exercise for up to 150 minutes weekly; and  sleep for at least 7 hours a day.   - I advised patient to maintain close follow up with Suzan Slickucker, Alethea Y, MD for primary care needs.  - Time spent on this patient care encounter:  35 min, of which > 50% was spent in  counseling and the rest reviewing his blood glucose logs , discussing his hypoglycemia and hyperglycemia episodes, reviewing his current and  previous labs / studies  (  including abstraction from other facilities) and medications  doses and developing a  long term treatment plan and documenting his care.   Please refer to Patient Instructions  for Blood Glucose Monitoring and Insulin/Medications Dosing Guide"  in media tab for additional information. Please  also refer to " Patient Self Inventory" in the Media  tab for reviewed elements of pertinent patient history.  Heron Nay participated in the discussions, expressed understanding, and voiced agreement with the above plans.  All questions were answered to his satisfaction. he is encouraged to contact clinic should he have any questions or concerns prior to his return visit.  Follow up plan: - Return in about 4 months (around 08/01/2020) for Diabetes follow up with A1c in office, Previsit labs, Bring glucometer and logs.  Rayetta Pigg, [facility] [address] [place], [place] [address] Phone: [phone] Fax: [phone]  04/03/2020, 11:26 AM

## 2020-04-03 NOTE — Patient Instructions (Signed)
Diabetes Mellitus and Nutrition, Adult When you have diabetes, or diabetes mellitus, it is very important to have healthy eating habits because your blood sugar (glucose) levels are greatly affected by what you eat and drink. Eating healthy foods in the right amounts, at about the same times every day, can help you:  Control your blood glucose.  Lower your risk of heart disease.  Improve your blood pressure.  Reach or maintain a healthy weight. What can affect my meal plan? Every person with diabetes is different, and each person has different needs for a meal plan. Your health care provider may recommend that you work with a dietitian to make a meal plan that is best for you. Your meal plan may vary depending on factors such as:  The calories you need.  The medicines you take.  Your weight.  Your blood glucose, blood pressure, and cholesterol levels.  Your activity level.  Other health conditions you have, such as heart or kidney disease. How do carbohydrates affect me? Carbohydrates, also called carbs, affect your blood glucose level more than any other type of food. Eating carbs naturally raises the amount of glucose in your blood. Carb counting is a method for keeping track of how many carbs you eat. Counting carbs is important to keep your blood glucose at a healthy level, especially if you use insulin or take certain oral diabetes medicines. It is important to know how many carbs you can safely have in each meal. This is different for every person. Your dietitian can help you calculate how many carbs you should have at each meal and for each snack. How does alcohol affect me? Alcohol can cause a sudden decrease in blood glucose (hypoglycemia), especially if you use insulin or take certain oral diabetes medicines. Hypoglycemia can be a life-threatening condition. Symptoms of hypoglycemia, such as sleepiness, dizziness, and confusion, are similar to symptoms of having too much  alcohol.  Do not drink alcohol if: ? Your health care provider tells you not to drink. ? You are pregnant, may be pregnant, or are planning to become pregnant.  If you drink alcohol: ? Do not drink on an empty stomach. ? Limit how much you use to:  0-1 drink a day for women.  0-2 drinks a day for men. ? Be aware of how much alcohol is in your drink. In the [place] one drink equals one 12 oz bottle of beer (355 mL), one 5 oz glass of wine (148 mL), or one 1 oz glass of hard liquor (44 mL). ? Keep yourself hydrated with water, diet soda, or unsweetened iced tea.  Keep in mind that regular soda, juice, and other mixers may contain a lot of sugar and must be counted as carbs. What are tips for following this plan? Reading food labels  Start by checking the serving size on the "Nutrition Facts" label of packaged foods and drinks. The amount of calories, carbs, fats, and other nutrients listed on the label is based on one serving of the item. Many items contain more than one serving per package.  Check the total grams (g) of carbs in one serving. You can calculate the number of servings of carbs in one serving by dividing the total carbs by 15. For example, if a food has 30 g of total carbs per serving, it would be equal to 2 servings of carbs.  Check the number of grams (g) of saturated fats and trans fats in one serving. Choose foods that have   a low amount or none of these fats.  Check the number of milligrams (mg) of salt (sodium) in one serving. Most people should limit total sodium intake to less than 2,300 mg per day.  Always check the nutrition information of foods labeled as "low-fat" or "nonfat." These foods may be higher in added sugar or refined carbs and should be avoided.  Talk to your dietitian to identify your daily goals for nutrients listed on the label. Shopping  Avoid buying canned, pre-made, or processed foods. These foods tend to be high in fat, sodium, and added  sugar.  Shop around the outside edge of the grocery store. This is where you will most often find fresh fruits and vegetables, bulk grains, fresh meats, and fresh dairy. Cooking  Use low-heat cooking methods, such as baking, instead of high-heat cooking methods like deep frying.  Cook using healthy oils, such as olive, canola, or sunflower oil.  Avoid cooking with butter, cream, or high-fat meats. Meal planning  Eat meals and snacks regularly, preferably at the same times every day. Avoid going long periods of time without eating.  Eat foods that are high in fiber, such as fresh fruits, vegetables, beans, and whole grains. Talk with your dietitian about how many servings of carbs you can eat at each meal.  Eat 4-6 oz (112-168 g) of lean protein each day, such as lean meat, chicken, fish, eggs, or tofu. One ounce (oz) of lean protein is equal to: ? 1 oz (28 g) of meat, chicken, or fish. ? 1 egg. ?  cup (62 g) of tofu.  Eat some foods each day that contain healthy fats, such as avocado, nuts, seeds, and fish.   What foods should I eat? Fruits Berries. Apples. Oranges. Peaches. Apricots. Plums. Grapes. Mango. Papaya. Pomegranate. Kiwi. Cherries. Vegetables Lettuce. Spinach. Leafy greens, including kale, chard, collard greens, and mustard greens. Beets. Cauliflower. Cabbage. Broccoli. Carrots. Green beans. Tomatoes. Peppers. Onions. Cucumbers. Brussels sprouts. Grains Whole grains, such as whole-wheat or whole-grain bread, crackers, tortillas, cereal, and pasta. Unsweetened oatmeal. Quinoa. Brown or wild rice. Meats and other proteins Seafood. Poultry without skin. Lean cuts of poultry and beef. Tofu. Nuts. Seeds. Dairy Low-fat or fat-free dairy products such as milk, yogurt, and cheese. The items listed above may not be a complete list of foods and beverages you can eat. Contact a dietitian for more information. What foods should I avoid? Fruits Fruits canned with  syrup. Vegetables Canned vegetables. Frozen vegetables with butter or cream sauce. Grains Refined white flour and flour products such as bread, pasta, snack foods, and cereals. Avoid all processed foods. Meats and other proteins Fatty cuts of meat. Poultry with skin. Breaded or fried meats. Processed meat. Avoid saturated fats. Dairy Full-fat yogurt, cheese, or milk. Beverages Sweetened drinks, such as soda or iced tea. The items listed above may not be a complete list of foods and beverages you should avoid. Contact a dietitian for more information. Questions to ask a health care provider  Do I need to meet with a diabetes educator?  Do I need to meet with a dietitian?  What number can I call if I have questions?  When are the best times to check my blood glucose? Where to find more information:  American Diabetes Association: diabetes.org  Academy of Nutrition and Dietetics: www.eatright.org  National Institute of Diabetes and Digestive and Kidney Diseases: www.niddk.nih.gov  Association of Diabetes Care and Education Specialists: www.diabeteseducator.org Summary  It is important to have healthy eating   habits because your blood sugar (glucose) levels are greatly affected by what you eat and drink.  A healthy meal plan will help you control your blood glucose and maintain a healthy lifestyle.  Your health care provider may recommend that you work with a dietitian to make a meal plan that is best for you.  Keep in mind that carbohydrates (carbs) and alcohol have immediate effects on your blood glucose levels. It is important to count carbs and to use alcohol carefully. This information is not intended to replace advice given to you by your health care provider. Make sure you discuss any questions you have with your health care provider. Document Revised: 01/30/2019 Document Reviewed: 01/30/2019 Elsevier Patient Education  2021 [employer]  

## 2020-05-20 ENCOUNTER — Other Ambulatory Visit: Payer: Self-pay | Admitting: Nurse Practitioner

## 2020-07-24 ENCOUNTER — Other Ambulatory Visit: Payer: Self-pay | Admitting: "Endocrinology

## 2020-07-31 LAB — COMPREHENSIVE METABOLIC PANEL
ALT: 54 IU/L — ABNORMAL HIGH (ref 0–44)
AST: 47 IU/L — ABNORMAL HIGH (ref 0–40)
Albumin/Globulin Ratio: 1.4 (ref 1.2–2.2)
Albumin: 3.9 g/dL (ref 3.8–4.8)
Alkaline Phosphatase: 306 IU/L — ABNORMAL HIGH (ref 44–121)
BUN/Creatinine Ratio: 26 — ABNORMAL HIGH (ref 10–24)
BUN: 21 mg/dL (ref 8–27)
Bilirubin Total: 1.1 mg/dL (ref 0.0–1.2)
CO2: 26 mmol/L (ref 20–29)
Calcium: 9.1 mg/dL (ref 8.6–10.2)
Chloride: 102 mmol/L (ref 96–106)
Creatinine, Ser: 0.8 mg/dL (ref 0.76–1.27)
Globulin, Total: 2.7 g/dL (ref 1.5–4.5)
Glucose: 118 mg/dL — ABNORMAL HIGH (ref 65–99)
Potassium: 3.8 mmol/L (ref 3.5–5.2)
Sodium: 140 mmol/L (ref 134–144)
Total Protein: 6.6 g/dL (ref 6.0–8.5)
eGFR: 96 mL/min/{1.73_m2} (ref >59)

## 2020-08-01 ENCOUNTER — Ambulatory Visit: Payer: Medicare Other | Admitting: Nurse Practitioner

## 2020-08-08 ENCOUNTER — Other Ambulatory Visit: Payer: Self-pay

## 2020-08-08 ENCOUNTER — Encounter: Payer: Self-pay | Admitting: Nurse Practitioner

## 2020-08-08 ENCOUNTER — Ambulatory Visit (INDEPENDENT_AMBULATORY_CARE_PROVIDER_SITE_OTHER): Payer: Medicare Other | Admitting: Nurse Practitioner

## 2020-08-08 VITALS — BP 126/76 | HR 66 | Ht 73.0 in | Wt 160.0 lb

## 2020-08-08 DIAGNOSIS — E108 Type 1 diabetes mellitus with unspecified complications: Secondary | ICD-10-CM

## 2020-08-08 DIAGNOSIS — E782 Mixed hyperlipidemia: Secondary | ICD-10-CM | POA: Diagnosis not present

## 2020-08-08 DIAGNOSIS — E1065 Type 1 diabetes mellitus with hyperglycemia: Secondary | ICD-10-CM | POA: Diagnosis not present

## 2020-08-08 DIAGNOSIS — I10 Essential (primary) hypertension: Secondary | ICD-10-CM | POA: Diagnosis not present

## 2020-08-08 DIAGNOSIS — IMO0002 Reserved for concepts with insufficient information to code with codable children: Secondary | ICD-10-CM

## 2020-08-08 LAB — POCT GLYCOSYLATED HEMOGLOBIN (HGB A1C): Hemoglobin A1C: 7 % — AB (ref 4.0–5.6)

## 2020-08-08 MED ORDER — TRESIBA FLEXTOUCH 100 UNIT/ML ~~LOC~~ SOPN
28.0000 [IU] | PEN_INJECTOR | Freq: Every day | SUBCUTANEOUS | 1 refills | Status: DC
Start: 2020-08-08 — End: 2021-01-09

## 2020-08-08 MED ORDER — NOVOLOG FLEXPEN 100 UNIT/ML ~~LOC~~ SOPN: 6.0000 [IU] | PEN_INJECTOR | Freq: Three times a day (TID) | SUBCUTANEOUS | 3 refills | Status: DC

## 2020-08-08 NOTE — Patient Instructions (Signed)

## 2020-08-08 NOTE — Progress Notes (Signed)
08/08/2020  Endocrinology Follow-up Note   Subjective:    Patient ID: William Beck, male    DOB: 12/25/1950.  He is being seen in follow-up for the management of his type 1 diabetes, hyperlipidemia, hypertension.    PMD:  Leeanne Rio, MD  Past Medical History:  Diagnosis Date  . Diabetes mellitus type I ([place])   . Hyperlipidemia   . Hypertension    Past Surgical History:  Procedure Laterality Date  . BACK SURGERY     Social History   Socioeconomic History  . Marital status: Widowed    Spouse name: Not on file  . Number of children: Not on file  . Years of education: Not on file  . Highest education level: Not on file  Occupational History  . Not on file  Tobacco Use  . Smoking status: Never Smoker  . Smokeless tobacco: Never Used  Vaping Use  . Vaping Use: Never used  Substance and Sexual Activity  . Alcohol use: No  . Drug use: No  . Sexual activity: Not on file  Other Topics Concern  . Not on file  Social History Narrative  . Not on file   Social Determinants of Health   Financial Resource Strain: Not on file  Food Insecurity: Not on file  Transportation Needs: Not on file  Physical Activity: Not on file  Stress: Not on file  Social Connections: Not on file   Outpatient Encounter Medications as of 08/08/2020  Medication Sig  . aspirin EC 81 MG tablet Take 81 mg by mouth daily.  [name] atorvastatin (LIPITOR) 40 MG tablet Take 1 tablet by mouth daily.  . clopidogrel (PLAVIX) 75 MG tablet Take 75 mg by mouth daily.  . Continuous Blood Gluc Sensor (FREESTYLE LIBRE 14 DAY SENSOR) MISC Inject 1 each into the skin every 14 (fourteen) days. Use as directed.  . insulin aspart (NOVOLOG FLEXPEN) 100 UNIT/ML FlexPen Inject 6-12 Units into the skin 3 (three) times daily with meals.  . insulin degludec (TRESIBA FLEXTOUCH) 100 UNIT/ML FlexTouch Pen Inject 28 Units into the skin at bedtime.  . Insulin Pen Needle (B-D ULTRAFINE III SHORT PEN) 31G X 8 MM MISC 1 each by  Does not apply route as directed.  . metoprolol succinate (TOPROL-XL) 25 MG 24 hr tablet Take 25 mg by mouth daily.  . montelukast (SINGULAIR) 10 MG tablet montelukast 10 mg tablet  TAKE 1 TABLET BY MOUTH EVERY DAY AT NIGHT  . Multiple Vitamin (MULTIVITAMIN) capsule Take 1 capsule by mouth daily.  . predniSONE (DELTASONE) 10 MG tablet Take 10 mg by mouth daily with breakfast.  . pyridostigmine (MESTINON) 60 MG tablet Take 30 mg by mouth 3 (three) times daily.  . [DISCONTINUED] insulin degludec (TRESIBA FLEXTOUCH) 100 UNIT/ML FlexTouch Pen Inject 28 Units into the skin at bedtime.  . [DISCONTINUED] NOVOLOG FLEXPEN 100 UNIT/ML FlexPen INJECT 8-14 UNITS INTO THE SKIN 3 (THREE) TIMES DAILY WITH MEALS.   No facility-administered encounter medications on file as of 08/08/2020.   ALLERGIES: No Known Allergies VACCINATION STATUS: Immunization History  Administered Date(s) Administered  . Moderna Sars-Covid-2 Vaccination 02/11/2020    Diabetes He presents for his follow-up diabetic visit. He has type 1 diabetes mellitus. Onset time: He was diagnosed at approximate age of 44 years. His disease course has been stable. Hypoglycemia symptoms include nervousness/anxiousness, sweats and tremors. Pertinent negatives for hypoglycemia include no confusion, headaches, hunger, pallor or seizures. There are no diabetic associated symptoms. Pertinent negatives for diabetes include no  chest pain, no fatigue, no polydipsia, no polyphagia, no polyuria and no weakness. Hypoglycemia complications include nocturnal hypoglycemia. (He has no precipitating symptoms, his wife notices it) Symptoms are stable. There are no diabetic complications. Risk factors for coronary artery disease include diabetes mellitus, dyslipidemia, hypertension, male sex and sedentary lifestyle. Current diabetic treatment includes intensive insulin program. He is compliant with treatment most of the time. His weight is fluctuating minimally. He is  following a diabetic diet. When asked about meal planning, he reported none. He has not had a previous visit with a dietitian. He participates in exercise intermittently. His home blood glucose trend is fluctuating minimally. His breakfast blood glucose range is generally 90-110 mg/dl. His lunch blood glucose range is generally 90-110 mg/dl. His dinner blood glucose range is generally 110-130 mg/dl. His bedtime blood glucose range is generally 110-130 mg/dl. (He presents today, accompanied by his wife, with his CGM and logs showing improved glycemic profile overall.  His POCT A1c today is 7%, essentially unchanged from previous visit of 6.8%.  Analysis of his CGM shows TIR 57%, TAR 33%, TBR 10% (improving from last visit of 12%).  He continues to have fasting hypoglycemia.  It is also noted that he injects his short-acting insulin even if his glucose readings are low which we discussed as being a safety hazard today.) An ACE inhibitor/angiotensin II receptor blocker is being taken. He sees a podiatrist.Eye exam is current.  Hyperlipidemia This is a chronic problem. The current episode started more than 1 year ago. The problem is controlled. Recent lipid tests were reviewed and are normal. Exacerbating diseases include diabetes. He has no history of chronic renal disease. Factors aggravating his hyperlipidemia include fatty foods. Pertinent negatives include no chest pain, myalgias or shortness of breath. Current antihyperlipidemic treatment includes statins. The current treatment provides moderate improvement of lipids. Compliance problems include adherence to diet and adherence to exercise.  Risk factors for coronary artery disease include diabetes mellitus, dyslipidemia, hypertension, a sedentary lifestyle and male sex.  Hypertension This is a chronic problem. The current episode started more than 1 year ago. The problem has been gradually improving since onset. The problem is controlled. Associated symptoms  include sweats. Pertinent negatives include no chest pain, headaches, neck pain, palpitations or shortness of breath. There are no associated agents to hypertension. Risk factors for coronary artery disease include dyslipidemia, diabetes mellitus and male gender. Past treatments include beta blockers. The current treatment provides moderate improvement. There are no compliance problems.  There is no history of chronic renal disease.    Review of systems  Constitutional: + minimally fluctuating body weight, current Body mass index is 21.11 kg/m. , no fatigue, no subjective hyperthermia, no subjective hypothermia Eyes: no blurry vision, no xerophthalmia ENT: no sore throat, no nodules palpated in throat, no dysphagia/odynophagia, no hoarseness Cardiovascular: no chest pain, no shortness of breath, no palpitations, no leg swelling Respiratory: no cough, no shortness of breath Gastrointestinal: no nausea/vomiting/diarrhea Musculoskeletal: no muscle/joint aches, + right foot pain (wears compressive brace) Skin: no rashes, no hyperemia Neurological: no tremors, no numbness, no tingling, no dizziness Psychiatric: no depression, no anxiety    Objective:    BP 126/76   Pulse 66   Ht _0  (1.854 m)   Wt 160 lb (72.6 kg)   BMI 21.11 kg/m   Wt Readings from Last 3 Encounters:  08/08/20 160 lb (72.6 kg)  04/03/20 161 lb 9.6 oz (73.3 kg)  11/22/19 159 lb (72.1 kg)  BP Readings from Last 3 Encounters:  08/08/20 126/76  04/03/20 (!) 143/64  11/22/19 110/64    Physical Exam- Limited  Constitutional:  Body mass index is 21.11 kg/m. , not in acute distress, normal state of mind Eyes:  EOMI, no exophthalmos Neck: Supple Respiratory: Adequate breathing efforts Musculoskeletal: right foot deformity (from previous fracture), strength intact in all four extremities, no gross restriction of joint movements Skin:  no rashes, no hyperemia Neurological: no tremor with outstretched  hands   Anti-islet antibodies from 11/25/2016 where negative.  Recent Results (from the past 2160 hour(s))  Comprehensive metabolic panel     Status: Abnormal   Collection Time: 07/30/20  8:26 AM  Result Value Ref Range   Glucose 118 (H) 65 - 99 mg/dL   BUN 21 8 - 27 mg/dL   Creatinine, Ser 0.80 0.76 - 1.27 mg/dL   eGFR 96 >59 mL/min/1.73   BUN/Creatinine Ratio 26 (H) 10 - 24   Sodium 140 134 - 144 mmol/L   Potassium 3.8 3.5 - 5.2 mmol/L   Chloride 102 96 - 106 mmol/L   CO2 26 20 - 29 mmol/L   Calcium 9.1 8.6 - 10.2 mg/dL   Total Protein 6.6 6.0 - 8.5 g/dL   Albumin 3.9 3.8 - 4.8 g/dL   Globulin, Total 2.7 1.5 - 4.5 g/dL   Albumin/Globulin Ratio 1.4 1.2 - 2.2   Bilirubin Total 1.1 0.0 - 1.2 mg/dL   Alkaline Phosphatase 306 (H) 44 - 121 IU/L   AST 47 (H) 0 - 40 IU/L   ALT 54 (H) 0 - 44 IU/L  HgB A1c     Status: Abnormal   Collection Time: 08/08/20 10:02 AM  Result Value Ref Range   Hemoglobin A1C 7.0 (A) 4.0 - 5.6 %   HbA1c POC (<> result, manual entry)     HbA1c, POC (prediabetic range)     HbA1c, POC (controlled diabetic range)      Assessment & Plan:   1) Diabetes mellitus with complication (HCC)- Type unspecified- likely type 1  - Patient has currently controlled asymptomatic diabetes diagnosed since 69 years of age.  He presents today, accompanied by his wife, with his CGM and logs showing improved glycemic profile overall.  His POCT A1c today is 7%, essentially unchanged from previous visit of 6.8%.  Analysis of his CGM shows TIR 57%, TAR 33%, TBR 10% (improving from last visit of 12%).  He continues to have fasting hypoglycemia.  It is also noted that he injects his short-acting insulin even if his glucose readings are low which we discussed as being a safety hazard today.   Patient denies any gross complications, however he remains at a high risk for more acute and chronic complications which include CAD, CVA, CKD, retinopathy, and neuropathy. These are all  discussed in detail with the patient.  - Nutritional counseling repeated at each appointment due to patients tendency to fall back in to old habits.  - The patient admits there is a room for improvement in their diet and drink choices. -  Suggestion is made for the patient to avoid simple carbohydrates from their diet including Cakes, Sweet Desserts / Pastries, Ice Cream, Soda (diet and regular), Sweet Tea, Candies, Chips, Cookies, Sweet Pastries, Store Bought Juices, Alcohol in Excess of 1-2 drinks a day, Artificial Sweeteners, Coffee Creamer, and "Sugar-free" Products. This will help patient to have stable blood glucose profile and potentially avoid unintended weight gain.   - I encouraged the patient to switch  to unprocessed or minimally processed complex starch and increased protein intake (animal or plant source), fruits, and vegetables.   - Patient is advised to stick to a routine mealtimes to eat 3 meals a day and avoid unnecessary snacks (to snack only to correct hypoglycemia).  - I have approached patient with the following individualized plan to manage diabetes and patient agrees:   -He will continue to require intensive treatment with basal/bolus insulin in order for him to achieve and maintain control of diabetes to target.  He will continue to need mostly one-on-one assistance with meals and insulin administration.   -Given his continued tight fasting glucose readings, he is advised to lower his Tyler Aas further to 22 units nightly and lower his Novolog to 6-12 units TID with meals if glucose is above 90 and he is eating (specific instructions on how to titrate insulin doses based on glucose readings given to patient in writing).   -He is advised to continue using his CGM to monitor glucose 4 times per day, before meals and at bedtime and call the clinic if blood glucose is below 70 or greater than 200 for 3 tests in a row.  -His wife is strict with his diet and will limit carbs to  prevent glucose spikes as he worries about his blood glucose being too high.  They are instructed to add more carbs to each meal, which will assist in slow, steady weight gain.  - Patient is warned not to take insulin without proper monitoring per orders.  - Patient specific target  A1c;  LDL, HDL, Triglycerides,  were discussed in detail.  2) BP/HTN:  His blood pressure is controlled to target. He is advised to continue Metoprolol 25 mg po daily.  3) Lipids/HPL:  His recent lipid panel from 11/13/19 shows controlled LDL at 84.  He is advised to continue Lipitor 40 mg po daily at bedtime.  Side effects and precautions discussed with him.  Will recheck lipid panel prior to next visit.  4)  Weight/Diet:  His Body mass index is 21.11 kg/m.- not a candidate for weight loss.   CDE Consult has been initiated , exercise, and detailed carbohydrates information provided.  5) Chronic Care/Health Maintenance: -Patient is on Statin medications and encouraged to continue to follow up with Ophthalmology, Podiatrist at least yearly or according to recommendations, and advised to   stay away from smoking. I have recommended yearly flu vaccine and pneumonia vaccination at least every 5 years; moderate intensity exercise for up to 150 minutes weekly; and  sleep for at least 7 hours a day.   - I advised patient to maintain close follow up with Leeanne Rio, MD for primary care needs.    I spent 31 minutes in the care of the patient today including review of labs from [place], Lipids, Thyroid Function, Hematology (current and previous including abstractions from other facilities); face-to-face time discussing  his blood glucose readings/logs, discussing hypoglycemia and hyperglycemia episodes and symptoms, medications doses, his options of short and long term treatment based on the latest standards of care / guidelines;  discussion about incorporating lifestyle medicine;  and documenting the encounter.     Please refer to Patient Instructions for Blood Glucose Monitoring and Insulin/Medications Dosing Guide"  in media tab for additional information. Please  also refer to " Patient Self Inventory" in the Media  tab for reviewed elements of pertinent patient history.  Heron Nay participated in the discussions, expressed understanding, and voiced agreement with  the above plans.  All questions were answered to his satisfaction. he is encouraged to contact clinic should he have any questions or concerns prior to his return visit.    Follow up plan: - Return in about 4 months (around 12/08/2020) for Diabetes F/U with A1c in office, Previsit labs, Bring meter and logs.    Rayetta Pigg, [facility] [address] [place], [place] [address] Phone: [phone] Fax: [phone]  08/08/2020, 10:10 AM

## 2020-09-10 ENCOUNTER — Telehealth: Payer: Self-pay | Admitting: Nurse Practitioner

## 2020-09-10 NOTE — Telephone Encounter (Signed)
Pts wife notified and agrees 

## 2020-09-10 NOTE — Telephone Encounter (Signed)
Goal sugars for fasting (morning time) should be between 90-130 and < 200 any other time throughout the day.  We expect his glucose readings to go up and down throughout the day because he is eating.  He does not need to cut back on his meal consumption, we dont want him to lose any more weight.  I can adjust his insulin so his readings so he can eat a bit more.  He should be on Tresiba 22 units now, lets increase that to 26 units nightly.  He should also be taking Novolog 6-12 units TID with meals.  He can continue that dose for now.

## 2020-09-10 NOTE — Telephone Encounter (Signed)
Pt wife called, Santiago Glad, in regards to patient readings. She states he has stopped eating as much, and has now lost 3 pounds because his sugar was in the 200s and he is very fixated on his sugar readings. When they get to 150 he starts freaking out. She is requesting a call back. She states in the last week his readings are better but that is because he is not eating near as much as what he was, but is resulting into weight loss. Readings are recorded, breakfast, lunch, supper, bed.    7/1 209, 156, 266, 125  7/2 121, 159, 114, 66  7/3 147, 97, 142, 87  7/4 178, 100, 161, 70  7/5 110, 117, 257, 130.  Also states that pt wants to know what his sugar should be ranging. [phone]

## 2020-10-17 ENCOUNTER — Other Ambulatory Visit: Payer: Self-pay | Admitting: "Endocrinology

## 2020-11-24 ENCOUNTER — Other Ambulatory Visit: Payer: Self-pay | Admitting: Nurse Practitioner

## 2020-12-02 LAB — COMPREHENSIVE METABOLIC PANEL
ALT: 58 IU/L — ABNORMAL HIGH (ref 0–44)
AST: 50 IU/L — ABNORMAL HIGH (ref 0–40)
Albumin/Globulin Ratio: 1.4 (ref 1.2–2.2)
Albumin: 3.7 g/dL — ABNORMAL LOW (ref 3.8–4.8)
Alkaline Phosphatase: 308 IU/L — ABNORMAL HIGH (ref 44–121)
BUN/Creatinine Ratio: 28 — ABNORMAL HIGH (ref 10–24)
BUN: 23 mg/dL (ref 8–27)
Bilirubin Total: 0.8 mg/dL (ref 0.0–1.2)
CO2: 23 mmol/L (ref 20–29)
Calcium: 9.2 mg/dL (ref 8.6–10.2)
Chloride: 108 mmol/L — ABNORMAL HIGH (ref 96–106)
Creatinine, Ser: 0.82 mg/dL (ref 0.76–1.27)
Globulin, Total: 2.7 g/dL (ref 1.5–4.5)
Glucose: 122 mg/dL — ABNORMAL HIGH (ref 70–99)
Potassium: 4.7 mmol/L (ref 3.5–5.2)
Sodium: 144 mmol/L (ref 134–144)
Total Protein: 6.4 g/dL (ref 6.0–8.5)
eGFR: 95 mL/min/{1.73_m2} (ref >59)

## 2020-12-02 LAB — LIPID PANEL
Chol/HDL Ratio: 2.4 ratio (ref 0.0–5.0)
Cholesterol, Total: 162 mg/dL (ref 100–199)
HDL: 67 mg/dL (ref >39)
LDL Chol Calc (NIH): 80 mg/dL (ref 0–99)
Triglycerides: 78 mg/dL (ref 0–149)
VLDL Cholesterol Cal: 15 mg/dL (ref 5–40)

## 2020-12-02 LAB — TSH: TSH: 2.23 u[IU]/mL (ref 0.450–4.500)

## 2020-12-02 LAB — T4, FREE: Free T4: 1.35 ng/dL (ref 0.82–1.77)

## 2020-12-08 NOTE — Patient Instructions (Signed)
Diabetes Mellitus and Nutrition, Adult When you have diabetes, or diabetes mellitus, it is very important to have healthy eating habits because your blood sugar (glucose) levels are greatly affected by what you eat and drink. Eating healthy foods in the right amounts, at about the same times every day, can help you:  Control your blood glucose.  Lower your risk of heart disease.  Improve your blood pressure.  Reach or maintain a healthy weight. What can affect my meal plan? Every person with diabetes is different, and each person has different needs for a meal plan. Your health care provider may recommend that you work with a dietitian to make a meal plan that is best for you. Your meal plan may vary depending on factors such as:  The calories you need.  The medicines you take.  Your weight.  Your blood glucose, blood pressure, and cholesterol levels.  Your activity level.  Other health conditions you have, such as heart or kidney disease. How do carbohydrates affect me? Carbohydrates, also called carbs, affect your blood glucose level more than any other type of food. Eating carbs naturally raises the amount of glucose in your blood. Carb counting is a method for keeping track of how many carbs you eat. Counting carbs is important to keep your blood glucose at a healthy level, especially if you use insulin or take certain oral diabetes medicines. It is important to know how many carbs you can safely have in each meal. This is different for every person. Your dietitian can help you calculate how many carbs you should have at each meal and for each snack. How does alcohol affect me? Alcohol can cause a sudden decrease in blood glucose (hypoglycemia), especially if you use insulin or take certain oral diabetes medicines. Hypoglycemia can be a life-threatening condition. Symptoms of hypoglycemia, such as sleepiness, dizziness, and confusion, are similar to symptoms of having too much  alcohol.  Do not drink alcohol if: ? Your health care provider tells you not to drink. ? You are pregnant, may be pregnant, or are planning to become pregnant.  If you drink alcohol: ? Do not drink on an empty stomach. ? Limit how much you use to:  0-1 drink a day for women.  0-2 drinks a day for men. ? Be aware of how much alcohol is in your drink. In the [place] one drink equals one 12 oz bottle of beer (355 mL), one 5 oz glass of wine (148 mL), or one 1 oz glass of hard liquor (44 mL). ? Keep yourself hydrated with water, diet soda, or unsweetened iced tea.  Keep in mind that regular soda, juice, and other mixers may contain a lot of sugar and must be counted as carbs. What are tips for following this plan? Reading food labels  Start by checking the serving size on the "Nutrition Facts" label of packaged foods and drinks. The amount of calories, carbs, fats, and other nutrients listed on the label is based on one serving of the item. Many items contain more than one serving per package.  Check the total grams (g) of carbs in one serving. You can calculate the number of servings of carbs in one serving by dividing the total carbs by 15. For example, if a food has 30 g of total carbs per serving, it would be equal to 2 servings of carbs.  Check the number of grams (g) of saturated fats and trans fats in one serving. Choose foods that have   a low amount or none of these fats.  Check the number of milligrams (mg) of salt (sodium) in one serving. Most people should limit total sodium intake to less than 2,300 mg per day.  Always check the nutrition information of foods labeled as "low-fat" or "nonfat." These foods may be higher in added sugar or refined carbs and should be avoided.  Talk to your dietitian to identify your daily goals for nutrients listed on the label. Shopping  Avoid buying canned, pre-made, or processed foods. These foods tend to be high in fat, sodium, and added  sugar.  Shop around the outside edge of the grocery store. This is where you will most often find fresh fruits and vegetables, bulk grains, fresh meats, and fresh dairy. Cooking  Use low-heat cooking methods, such as baking, instead of high-heat cooking methods like deep frying.  Cook using healthy oils, such as olive, canola, or sunflower oil.  Avoid cooking with butter, cream, or high-fat meats. Meal planning  Eat meals and snacks regularly, preferably at the same times every day. Avoid going long periods of time without eating.  Eat foods that are high in fiber, such as fresh fruits, vegetables, beans, and whole grains. Talk with your dietitian about how many servings of carbs you can eat at each meal.  Eat 4-6 oz (112-168 g) of lean protein each day, such as lean meat, chicken, fish, eggs, or tofu. One ounce (oz) of lean protein is equal to: ? 1 oz (28 g) of meat, chicken, or fish. ? 1 egg. ?  cup (62 g) of tofu.  Eat some foods each day that contain healthy fats, such as avocado, nuts, seeds, and fish.   What foods should I eat? Fruits Berries. Apples. Oranges. Peaches. Apricots. Plums. Grapes. Mango. Papaya. Pomegranate. Kiwi. Cherries. Vegetables Lettuce. Spinach. Leafy greens, including kale, chard, collard greens, and mustard greens. Beets. Cauliflower. Cabbage. Broccoli. Carrots. Green beans. Tomatoes. Peppers. Onions. Cucumbers. Brussels sprouts. Grains Whole grains, such as whole-wheat or whole-grain bread, crackers, tortillas, cereal, and pasta. Unsweetened oatmeal. Quinoa. Brown or wild rice. Meats and other proteins Seafood. Poultry without skin. Lean cuts of poultry and beef. Tofu. Nuts. Seeds. Dairy Low-fat or fat-free dairy products such as milk, yogurt, and cheese. The items listed above may not be a complete list of foods and beverages you can eat. Contact a dietitian for more information. What foods should I avoid? Fruits Fruits canned with  syrup. Vegetables Canned vegetables. Frozen vegetables with butter or cream sauce. Grains Refined white flour and flour products such as bread, pasta, snack foods, and cereals. Avoid all processed foods. Meats and other proteins Fatty cuts of meat. Poultry with skin. Breaded or fried meats. Processed meat. Avoid saturated fats. Dairy Full-fat yogurt, cheese, or milk. Beverages Sweetened drinks, such as soda or iced tea. The items listed above may not be a complete list of foods and beverages you should avoid. Contact a dietitian for more information. Questions to ask a health care provider  Do I need to meet with a diabetes educator?  Do I need to meet with a dietitian?  What number can I call if I have questions?  When are the best times to check my blood glucose? Where to find more information:  American Diabetes Association: diabetes.org  Academy of Nutrition and Dietetics: www.eatright.org  National Institute of Diabetes and Digestive and Kidney Diseases: www.niddk.nih.gov  Association of Diabetes Care and Education Specialists: www.diabeteseducator.org Summary  It is important to have healthy eating   habits because your blood sugar (glucose) levels are greatly affected by what you eat and drink.  A healthy meal plan will help you control your blood glucose and maintain a healthy lifestyle.  Your health care provider may recommend that you work with a dietitian to make a meal plan that is best for you.  Keep in mind that carbohydrates (carbs) and alcohol have immediate effects on your blood glucose levels. It is important to count carbs and to use alcohol carefully. This information is not intended to replace advice given to you by your health care provider. Make sure you discuss any questions you have with your health care provider. Document Revised: 01/30/2019 Document Reviewed: 01/30/2019 Elsevier Patient Education  2021 [employer]  

## 2020-12-09 ENCOUNTER — Ambulatory Visit (INDEPENDENT_AMBULATORY_CARE_PROVIDER_SITE_OTHER): Payer: Medicare Other | Admitting: Nurse Practitioner

## 2020-12-09 ENCOUNTER — Encounter: Payer: Self-pay | Admitting: Nurse Practitioner

## 2020-12-09 ENCOUNTER — Other Ambulatory Visit: Payer: Self-pay

## 2020-12-09 VITALS — BP 88/51 | HR 76 | Ht 73.0 in | Wt 155.8 lb

## 2020-12-09 DIAGNOSIS — I10 Essential (primary) hypertension: Secondary | ICD-10-CM

## 2020-12-09 DIAGNOSIS — R634 Abnormal weight loss: Secondary | ICD-10-CM | POA: Diagnosis not present

## 2020-12-09 DIAGNOSIS — E109 Type 1 diabetes mellitus without complications: Secondary | ICD-10-CM | POA: Diagnosis not present

## 2020-12-09 DIAGNOSIS — E782 Mixed hyperlipidemia: Secondary | ICD-10-CM | POA: Diagnosis not present

## 2020-12-09 LAB — POCT GLYCOSYLATED HEMOGLOBIN (HGB A1C): HbA1c, POC (controlled diabetic range): 8.1 % — AB (ref 0.0–7.0)

## 2020-12-09 NOTE — Progress Notes (Signed)
12/09/2020  Endocrinology Follow-up Note   Subjective:    Patient ID: William Beck, male    DOB: Sep 09, 1950.  He is being seen in follow-up for the management of his type 1 diabetes, hyperlipidemia, hypertension.    PMD:  Leeanne Rio, MD  Past Medical History:  Diagnosis Date   Diabetes mellitus type I ([place])    Hyperlipidemia    Hypertension    Past Surgical History:  Procedure Laterality Date   BACK SURGERY     Social History   Socioeconomic History   Marital status: Widowed    Spouse name: Not on file   Number of children: Not on file   Years of education: Not on file   Highest education level: Not on file  Occupational History   Not on file  Tobacco Use   Smoking status: Never   Smokeless tobacco: Never  Vaping Use   Vaping Use: Never used  Substance and Sexual Activity   Alcohol use: No   Drug use: No   Sexual activity: Not on file  Other Topics Concern   Not on file  Social History Narrative   Not on file   Social Determinants of Health   Financial Resource Strain: Not on file  Food Insecurity: Not on file  Transportation Needs: Not on file  Physical Activity: Not on file  Stress: Not on file  Social Connections: Not on file   Outpatient Encounter Medications as of 12/09/2020  Medication Sig   aspirin EC 81 MG tablet Take 81 mg by mouth daily.   chlorhexidine (PERIDEX) 0.12 % solution 15 mLs 2 (two) times daily.   clopidogrel (PLAVIX) 75 MG tablet Take 75 mg by mouth daily.   Continuous Blood Gluc Sensor (FREESTYLE LIBRE 14 DAY SENSOR) MISC Inject 1 each into the skin every 14 (fourteen) days. Use as directed.   insulin degludec (TRESIBA FLEXTOUCH) 100 UNIT/ML FlexTouch Pen Inject 28 Units into the skin at bedtime. (Patient taking differently: Inject 26 Units into the skin at bedtime.)   Insulin Pen Needle (B-D ULTRAFINE III SHORT PEN) 31G X 8 MM MISC 1 each by Does not apply route as directed.   megestrol (MEGACE) 40 MG tablet Take 40 mg by  mouth daily.   metoprolol tartrate (LOPRESSOR) 25 MG tablet Take 25 mg by mouth 2 (two) times daily.   mirtazapine (REMERON) 15 MG tablet Take 15 mg by mouth at bedtime.   montelukast (SINGULAIR) 10 MG tablet montelukast 10 mg tablet  TAKE 1 TABLET BY MOUTH EVERY DAY AT NIGHT   Multiple Vitamin (MULTIVITAMIN) capsule Take 1 capsule by mouth daily.   NOVOLOG FLEXPEN 100 UNIT/ML FlexPen INJECT 6-12 UNITS INTO THE SKIN 3 (THREE) TIMES DAILY WITH MEALS.   predniSONE (DELTASONE) 10 MG tablet Take 10 mg by mouth daily with breakfast.   pyridostigmine (MESTINON) 60 MG tablet Take 30 mg by mouth 3 (three) times daily.   triamcinolone cream (KENALOG) 0.1 % Apply topically as needed.   atorvastatin (LIPITOR) 40 MG tablet Take 1 tablet by mouth daily.   [DISCONTINUED] metoprolol succinate (TOPROL-XL) 25 MG 24 hr tablet Take 25 mg by mouth daily. (Patient not taking: Reported on 12/09/2020)   No facility-administered encounter medications on file as of 12/09/2020.   ALLERGIES: No Known Allergies VACCINATION STATUS: Immunization History  Administered Date(s) Administered   Moderna Sars-Covid-2 Vaccination 02/11/2020    Diabetes He presents for his follow-up diabetic visit. He has type 1 diabetes mellitus. Onset time: He was diagnosed  at approximate age of 9 years. His disease course has been worsening. Hypoglycemia symptoms include nervousness/anxiousness, sweats and tremors. Pertinent negatives for hypoglycemia include no confusion, headaches, hunger, pallor or seizures. Associated symptoms include weight loss. Pertinent negatives for diabetes include no chest pain, no fatigue, no polydipsia, no polyphagia, no polyuria and no weakness. There are no hypoglycemic complications. (He has no precipitating symptoms, his wife notices it) Symptoms are stable. There are no diabetic complications. Risk factors for coronary artery disease include diabetes mellitus, dyslipidemia, hypertension, male sex and sedentary  lifestyle. Current diabetic treatment includes intensive insulin program. He is compliant with treatment most of the time. His weight is decreasing steadily. He is following a diabetic diet. When asked about meal planning, he reported none. He has not had a previous visit with a dietitian. He participates in exercise intermittently. His home blood glucose trend is increasing steadily. His overall blood glucose range is 180-200 mg/dl. (He presents today, accompanied by his wife, with his CGM and logs showing increasing glycemic profile, both fasting and postprandially.  His POCT A1c today is 8.1%, increasing from last visit of 7%.  Analysis of his CGM shows TIR 45%, TAR 53%, TBR 2%.  He states he is eating more now, trying to gain weight, unsuccessfully even despite being on appetite stimulant.  He is used to having super control of his glucose readings and, per his wife, is obsessing over these most recent higher readings.) An ACE inhibitor/angiotensin II receptor blocker is being taken. He sees a podiatrist.Eye exam is current.  Hyperlipidemia This is a chronic problem. The current episode started more than 1 year ago. The problem is controlled. Recent lipid tests were reviewed and are normal. Exacerbating diseases include diabetes. He has no history of chronic renal disease. Factors aggravating his hyperlipidemia include fatty foods. Pertinent negatives include no chest pain, myalgias or shortness of breath. Current antihyperlipidemic treatment includes statins. The current treatment provides moderate improvement of lipids. Compliance problems include adherence to diet and adherence to exercise.  Risk factors for coronary artery disease include diabetes mellitus, dyslipidemia, hypertension, a sedentary lifestyle and male sex.  Hypertension This is a chronic problem. The current episode started more than 1 year ago. The problem has been gradually improving since onset. The problem is controlled. Associated  symptoms include sweats. Pertinent negatives include no chest pain, headaches, neck pain, palpitations or shortness of breath. There are no associated agents to hypertension. Risk factors for coronary artery disease include dyslipidemia, diabetes mellitus and male gender. Past treatments include beta blockers. The current treatment provides moderate improvement. There are no compliance problems.  There is no history of chronic renal disease.   Review of systems  Constitutional: +steadily decreasing body weight, current Body mass index is 20.56 kg/m. , no fatigue, no subjective hyperthermia, no subjective hypothermia Eyes: no blurry vision, no xerophthalmia ENT: no sore throat, no nodules palpated in throat, no dysphagia/odynophagia, no hoarseness Cardiovascular: no chest pain, no shortness of breath, no palpitations, no leg swelling Respiratory: no cough, no shortness of breath Gastrointestinal: no nausea/vomiting/diarrhea Musculoskeletal: no muscle/joint aches, Skin: no rashes, no hyperemia Neurological: no tremors, no numbness, no tingling, no dizziness Psychiatric: no depression, no anxiety    Objective:    BP (!) 88/51   Pulse 76   Ht _0  (1.854 m)   Wt 155 lb 12.8 oz (70.7 kg)   BMI 20.56 kg/m   Wt Readings from Last 3 Encounters:  12/09/20 155 lb 12.8 oz (70.7 kg)  08/08/20  160 lb (72.6 kg)  04/03/20 161 lb 9.6 oz (73.3 kg)    BP Readings from Last 3 Encounters:  12/09/20 (!) 88/51  08/08/20 126/76  04/03/20 (!) 143/64    Physical Exam- Limited  Constitutional:  Body mass index is 20.56 kg/m. , not in acute distress, normal state of mind Eyes:  EOMI, no exophthalmos Neck: Supple Respiratory: Adequate breathing efforts Musculoskeletal: right foot deformity (from previous fracture), strength intact in all four extremities, no gross restriction of joint movements Skin:  no rashes, no hyperemia Neurological: no tremor with outstretched hands   Anti-islet  antibodies from 11/25/2016 where negative.  Recent Results (from the past 2160 hour(s))  Comprehensive metabolic panel     Status: Abnormal   Collection Time: 12/01/20  9:16 AM  Result Value Ref Range   Glucose 122 (H) 70 - 99 mg/dL    Comment:               **Please note reference interval change**   BUN 23 8 - 27 mg/dL   Creatinine, Ser 0.82 0.76 - 1.27 mg/dL   eGFR 95 >59 mL/min/1.73   BUN/Creatinine Ratio 28 (H) 10 - 24   Sodium 144 134 - 144 mmol/L   Potassium 4.7 3.5 - 5.2 mmol/L   Chloride 108 (H) 96 - 106 mmol/L   CO2 23 20 - 29 mmol/L   Calcium 9.2 8.6 - 10.2 mg/dL   Total Protein 6.4 6.0 - 8.5 g/dL   Albumin 3.7 (L) 3.8 - 4.8 g/dL   Globulin, Total 2.7 1.5 - 4.5 g/dL   Albumin/Globulin Ratio 1.4 1.2 - 2.2   Bilirubin Total 0.8 0.0 - 1.2 mg/dL   Alkaline Phosphatase 308 (H) 44 - 121 IU/L   AST 50 (H) 0 - 40 IU/L   ALT 58 (H) 0 - 44 IU/L  Lipid panel     Status: None   Collection Time: 12/01/20  9:16 AM  Result Value Ref Range   Cholesterol, Total 162 100 - 199 mg/dL   Triglycerides 78 0 - 149 mg/dL   HDL 67 >39 mg/dL   VLDL Cholesterol Cal 15 5 - 40 mg/dL   LDL Chol Calc (NIH) 80 0 - 99 mg/dL   Chol/HDL Ratio 2.4 0.0 - 5.0 ratio    Comment:                                   T. Chol/HDL Ratio                                             Men  Women                               1/2 Avg.Risk  3.4    3.3                                   Avg.Risk  5.0    4.4                                2X Avg.Risk  9.6    7.1  3X Avg.Risk 23.4   11.0   T4, free     Status: None   Collection Time: 12/01/20  9:16 AM  Result Value Ref Range   Free T4 1.35 0.82 - 1.77 ng/dL  TSH     Status: None   Collection Time: 12/01/20  9:16 AM  Result Value Ref Range   TSH 2.230 0.450 - 4.500 uIU/mL  POCT glycosylated hemoglobin (Hb A1C)     Status: Abnormal   Collection Time: 12/09/20 10:05 AM  Result Value Ref Range   Hemoglobin A1C     HbA1c POC (<> result,  manual entry)     HbA1c, POC (prediabetic range)     HbA1c, POC (controlled diabetic range) 8.1 (A) 0.0 - 7.0 %    Assessment & Plan:   1) Diabetes mellitus with complication (HCC)- Type unspecified- likely type 1  - Patient has currently controlled asymptomatic diabetes diagnosed since 70 years of age.  He presents today, accompanied by his wife, with his CGM and logs showing increasing glycemic profile, both fasting and postprandially.  His POCT A1c today is 8.1%, increasing from last visit of 7%.  Analysis of his CGM shows TIR 45%, TAR 53%, TBR 2%.  He states he is eating more now, trying to gain weight, unsuccessfully even despite being on appetite stimulant.  He is used to having super control of his glucose readings and, per his wife, is obsessing over these most recent higher readings.   Patient denies any gross complications, however he remains at a high risk for more acute and chronic complications which include CAD, CVA, CKD, retinopathy, and neuropathy. These are all discussed in detail with the patient.  - Nutritional counseling repeated at each appointment due to patients tendency to fall back in to old habits.  - The patient admits there is a room for improvement in their diet and drink choices. -  Suggestion is made for the patient to avoid simple carbohydrates from their diet including Cakes, Sweet Desserts / Pastries, Ice Cream, Soda (diet and regular), Sweet Tea, Candies, Chips, Cookies, Sweet Pastries, Store Bought Juices, Alcohol in Excess of 1-2 drinks a day, Artificial Sweeteners, Coffee Creamer, and "Sugar-free" Products. This will help patient to have stable blood glucose profile and potentially avoid unintended weight gain.   - I encouraged the patient to switch to unprocessed or minimally processed complex starch and increased protein intake (animal or plant source), fruits, and vegetables.   - Patient is advised to stick to a routine mealtimes to eat 3 meals a day and  avoid unnecessary snacks (to snack only to correct hypoglycemia).  - I have approached patient with the following individualized plan to manage diabetes and patient agrees:   -He will continue to require intensive treatment with basal/bolus insulin in order for him to achieve and maintain control of diabetes to target.  He will continue to need mostly one-on-one assistance with meals and insulin administration.   -No changes will be made to his medication regimen today.  He is advised to continue Tresiba 26 units SQ nightly and continue Novolog 6-12 units TID with meals if glucose is above 90 and he is eating (Specific instructions on how to titrate insulin dosage based on glucose readings given to patient in writing).    -He is advised to continue using his CGM to monitor glucose 4 times per day, before meals and at bedtime and call the clinic if blood glucose is below 70 or greater than 200 for 3  tests in a row.  -Despite our best efforts, patient continues to lose weight which is concerning.  We need further workup to help determine etiology.  Will assess adrenal glands with AM cortisol, ferritin level, and CT abdomen with contrast to help rule out malignancy.  - Patient is warned not to take insulin without proper monitoring per orders.  - Patient specific target  A1c;  LDL, HDL, Triglycerides,  were discussed in detail.  2) BP/HTN:  His blood pressure is controlled to target- low today- has not drank much fluids today. He is advised to continue Metoprolol 25 mg po daily.  3) Lipids/HPL:  His recent lipid panel from 12/01/20 shows controlled LDL at 95.  He is advised to continue Lipitor 40 mg po daily at bedtime.  Side effects and precautions discussed with him.  May need to stop this medication due to elevated LFTs in the future.  4)  Weight/Diet:  His Body mass index is 20.56 kg/m.- not a candidate for weight loss.   CDE Consult has been initiated , exercise, and detailed carbohydrates  information provided.  5) Chronic Care/Health Maintenance: -Patient is on Statin medications and encouraged to continue to follow up with Ophthalmology, Podiatrist at least yearly or according to recommendations, and advised to   stay away from smoking. I have recommended yearly flu vaccine and pneumonia vaccination at least every 5 years; moderate intensity exercise for up to 150 minutes weekly; and  sleep for at least 7 hours a day.   - I advised patient to maintain close follow up with Leeanne Rio, MD for primary care needs.      I spent 40 minutes in the care of the patient today including review of labs from [facility], Lipids, Thyroid Function, Hematology (current and previous including abstractions from other facilities); face-to-face time discussing  his blood glucose readings/logs, discussing hypoglycemia and hyperglycemia episodes and symptoms, medications doses, his options of short and long term treatment based on the latest standards of care / guidelines;  discussion about incorporating lifestyle medicine;  and documenting the encounter.    Please refer to Patient Instructions for Blood Glucose Monitoring and Insulin/Medications Dosing Guide"  in media tab for additional information. Please  also refer to " Patient Self Inventory" in the Media  tab for reviewed elements of pertinent patient history.  Heron Nay participated in the discussions, expressed understanding, and voiced agreement with the above plans.  All questions were answered to his satisfaction. he is encouraged to contact clinic should he have any questions or concerns prior to his return visit.    Follow up plan: - Return in about 1 month (around 01/09/2021) for Diabetes F/U, Bring meter and logs, Previsit labs; CT scan.    Rayetta Pigg, [facility] [address] [place], [place] [address] Phone: [phone] Fax: [phone]  12/09/2020, 10:54 AM

## 2020-12-11 LAB — CORTISOL-AM, BLOOD: Cortisol - AM: 12.3 ug/dL (ref 6.2–19.4)

## 2020-12-11 LAB — FERRITIN: Ferritin: 12 ng/mL — ABNORMAL LOW (ref 30–400)

## 2020-12-29 ENCOUNTER — Ambulatory Visit (HOSPITAL_COMMUNITY)
Admission: RE | Admit: 2020-12-29 | Discharge: 2020-12-29 | Disposition: A | Discharge status: Home / Self Care | Payer: Medicare Other | Source: Ambulatory Visit | Attending: Nurse Practitioner | Admitting: Nurse Practitioner

## 2020-12-29 ENCOUNTER — Other Ambulatory Visit: Payer: Self-pay

## 2020-12-29 DIAGNOSIS — E109 Type 1 diabetes mellitus without complications: Secondary | ICD-10-CM | POA: Diagnosis not present

## 2020-12-29 DIAGNOSIS — R634 Abnormal weight loss: Secondary | ICD-10-CM | POA: Insufficient documentation

## 2020-12-29 MED ORDER — IOHEXOL 300 MG/ML  SOLN
100.0000 mL | Freq: Once | INTRAMUSCULAR | Status: AC | PRN
  Administered 2020-12-29: 100 mL via INTRAVENOUS

## 2021-01-05 ENCOUNTER — Other Ambulatory Visit: Payer: Self-pay | Admitting: "Endocrinology

## 2021-01-09 ENCOUNTER — Other Ambulatory Visit: Payer: Self-pay

## 2021-01-09 ENCOUNTER — Encounter: Payer: Self-pay | Admitting: "Endocrinology

## 2021-01-09 ENCOUNTER — Ambulatory Visit (INDEPENDENT_AMBULATORY_CARE_PROVIDER_SITE_OTHER): Payer: Medicare Other | Admitting: "Endocrinology

## 2021-01-09 VITALS — BP 92/66 | HR 80 | Ht 73.0 in | Wt 159.8 lb

## 2021-01-09 DIAGNOSIS — E109 Type 1 diabetes mellitus without complications: Secondary | ICD-10-CM

## 2021-01-09 DIAGNOSIS — I10 Essential (primary) hypertension: Secondary | ICD-10-CM | POA: Diagnosis not present

## 2021-01-09 DIAGNOSIS — E782 Mixed hyperlipidemia: Secondary | ICD-10-CM

## 2021-01-09 MED ORDER — FERROUS SULFATE 325 (65 FE) MG PO TABS: 325.0000 mg | ORAL_TABLET | Freq: Every day | ORAL | 1 refills | Status: DC

## 2021-01-09 MED ORDER — NOVOLOG FLEXPEN 100 UNIT/ML ~~LOC~~ SOPN
6.0000 [IU] | PEN_INJECTOR | Freq: Three times a day (TID) | SUBCUTANEOUS | 1 refills | Status: DC
Start: 2021-01-09 — End: 2021-03-18

## 2021-01-09 NOTE — Progress Notes (Signed)
01/09/2021  Endocrinology Follow-up Note   Subjective:    Patient ID: William Beck, male    DOB: 1950-03-31.  He is being seen in follow-up for the management of his type 1 diabetes, hyperlipidemia, hypertension.    PMD:  Leeanne Rio, MD  Past Medical History:  Diagnosis Date   Diabetes mellitus type I ([place])    Hyperlipidemia    Hypertension    Past Surgical History:  Procedure Laterality Date   BACK SURGERY     Social History   Socioeconomic History   Marital status: Married    Spouse name: Not on file   Number of children: Not on file   Years of education: Not on file   Highest education level: Not on file  Occupational History   Not on file  Tobacco Use   Smoking status: Never   Smokeless tobacco: Never  Vaping Use   Vaping Use: Never used  Substance and Sexual Activity   Alcohol use: No   Drug use: No   Sexual activity: Not on file  Other Topics Concern   Not on file  Social History Narrative   Not on file   Social Determinants of Health   Financial Resource Strain: Not on file  Food Insecurity: Not on file  Transportation Needs: Not on file  Physical Activity: Not on file  Stress: Not on file  Social Connections: Not on file   Outpatient Encounter Medications as of 01/09/2021  Medication Sig   ferrous sulfate (FERROUSUL) 325 (65 FE) MG tablet Take 1 tablet (325 mg total) by mouth daily with breakfast.   insulin degludec (TRESIBA) 100 UNIT/ML FlexTouch Pen Inject 30 Units into the skin at bedtime.   aspirin EC 81 MG tablet Take 81 mg by mouth daily.   atorvastatin (LIPITOR) 40 MG tablet Take 1 tablet by mouth daily.   chlorhexidine (PERIDEX) 0.12 % solution 15 mLs 2 (two) times daily.   clopidogrel (PLAVIX) 75 MG tablet Take 75 mg by mouth daily.   Continuous Blood Gluc Sensor (FREESTYLE LIBRE 14 DAY SENSOR) MISC Inject 1 each into the skin every 14 (fourteen) days. Use as directed.   insulin aspart (NOVOLOG FLEXPEN) 100 UNIT/ML FlexPen  Inject 6-9 Units into the skin 3 (three) times daily with meals.   Insulin Pen Needle (B-D ULTRAFINE III SHORT PEN) 31G X 8 MM MISC 1 each by Does not apply route as directed.   megestrol (MEGACE) 40 MG tablet Take 40 mg by mouth daily.   metoprolol tartrate (LOPRESSOR) 25 MG tablet Take 25 mg by mouth 2 (two) times daily.   mirtazapine (REMERON) 15 MG tablet Take 15 mg by mouth at bedtime.   montelukast (SINGULAIR) 10 MG tablet montelukast 10 mg tablet  TAKE 1 TABLET BY MOUTH EVERY DAY AT NIGHT   Multiple Vitamin (MULTIVITAMIN) capsule Take 1 capsule by mouth daily.   predniSONE (DELTASONE) 10 MG tablet Take 10 mg by mouth daily with breakfast.   pyridostigmine (MESTINON) 60 MG tablet Take 30 mg by mouth 3 (three) times daily.   triamcinolone cream (KENALOG) 0.1 % Apply topically as needed.   [DISCONTINUED] insulin degludec (TRESIBA FLEXTOUCH) 100 UNIT/ML FlexTouch Pen Inject 28 Units into the skin at bedtime. (Patient taking differently: Inject 26 Units into the skin at bedtime.)   [DISCONTINUED] NOVOLOG FLEXPEN 100 UNIT/ML FlexPen INJECT 6-12 UNITS INTO THE SKIN 3 (THREE) TIMES DAILY WITH MEALS.   No facility-administered encounter medications on file as of 01/09/2021.   ALLERGIES: No  Known Allergies VACCINATION STATUS: There is no immunization history for the selected administration types on file for this patient.   Diabetes He presents for his follow-up diabetic visit. He has type 1 diabetes mellitus. Onset time: He was diagnosed at approximate age of 35 years. His disease course has been worsening. Hypoglycemia symptoms include nervousness/anxiousness, sweats and tremors. Pertinent negatives for hypoglycemia include no confusion, headaches, hunger, pallor or seizures. Associated symptoms include weight loss. Pertinent negatives for diabetes include no chest pain, no fatigue, no polydipsia, no polyphagia, no polyuria and no weakness. There are no hypoglycemic complications. (He has no  precipitating symptoms, his wife notices it) Symptoms are worsening. There are no diabetic complications. Risk factors for coronary artery disease include diabetes mellitus, dyslipidemia, hypertension, male sex and sedentary lifestyle. Current diabetic treatment includes intensive insulin program. He is compliant with treatment most of the time. His weight is decreasing steadily. He is following a diabetic diet. When asked about meal planning, he reported none. He has not had a previous visit with a dietitian. He participates in exercise intermittently. His home blood glucose trend is increasing steadily. His breakfast blood glucose range is generally 180-200 mg/dl. His lunch blood glucose range is generally 180-200 mg/dl. His dinner blood glucose range is generally 180-200 mg/dl. His bedtime blood glucose range is generally 180-200 mg/dl. His overall blood glucose range is 180-200 mg/dl. (He presents today, accompanied by his wife, with his CGM and logs showing increasing glycemic profile, both fasting and postprandially.  His POCT A1c today is 8.5%, progressively increasing from last visit of 7%.  Analysis of his CGM shows TIR 46%, TAR 53%, TBR  <2%.  He states he is eating more now, trying to gain weight.his cane 5 pounds since last visit.  He is average blood glucose is 186 over the last 14 days.) An ACE inhibitor/angiotensin II receptor blocker is being taken. He sees a podiatrist.Eye exam is current.  Hyperlipidemia This is a chronic problem. The current episode started more than 1 year ago. The problem is controlled. Recent lipid tests were reviewed and are normal. Exacerbating diseases include diabetes. He has no history of chronic renal disease. Factors aggravating his hyperlipidemia include fatty foods. Pertinent negatives include no chest pain, myalgias or shortness of breath. Current antihyperlipidemic treatment includes statins. The current treatment provides moderate improvement of lipids. Compliance  problems include adherence to diet and adherence to exercise.  Risk factors for coronary artery disease include diabetes mellitus, dyslipidemia, hypertension, a sedentary lifestyle and male sex.  Hypertension This is a chronic problem. The current episode started more than 1 year ago. The problem has been gradually improving since onset. The problem is controlled. Associated symptoms include sweats. Pertinent negatives include no chest pain, headaches, neck pain, palpitations or shortness of breath. There are no associated agents to hypertension. Risk factors for coronary artery disease include dyslipidemia, diabetes mellitus and male gender. Past treatments include beta blockers. The current treatment provides moderate improvement. There are no compliance problems.  There is no history of chronic renal disease.   Review of systems  Constitutional: +steadily decreasing body weight, current Body mass index is 21.08 kg/m. , no fatigue, no subjective hyperthermia, no subjective hypothermia    Objective:    BP 92/66   Pulse 80   Ht '6\' 1"'  (1.854 m)   Wt 159 lb 12.8 oz (72.5 kg)   BMI 21.08 kg/m   Wt Readings from Last 3 Encounters:  01/09/21 159 lb 12.8 oz (72.5 kg)  12/09/20  155 lb 12.8 oz (70.7 kg)  08/08/20 160 lb (72.6 kg)    BP Readings from Last 3 Encounters:  01/09/21 92/66  12/09/20 (!) 88/51  08/08/20 126/76    Physical Exam- Limited  Constitutional:  Body mass index is 21.08 kg/m. , not in acute distress, normal state of mind Eyes:  + Pale conjunctivae, EOMI, no exophthalmos Neck: Supple Respiratory: Adequate breathing efforts Musculoskeletal: right foot deformity (from previous fracture), strength intact in all four extremities, no gross restriction of joint movements Skin:  no rashes, no hyperemia Neurological: no tremor with outstretched hands   Anti-islet antibodies from 11/25/2016 where negative.  Recent Results (from the past 2160 hour(s))  Comprehensive  metabolic panel     Status: Abnormal   Collection Time: 12/01/20  9:16 AM  Result Value Ref Range   Glucose 122 (H) 70 - 99 mg/dL    Comment:               **Please note reference interval change**   BUN 23 8 - 27 mg/dL   Creatinine, Ser 0.82 0.76 - 1.27 mg/dL   eGFR 95 >59 mL/min/1.73   BUN/Creatinine Ratio 28 (H) 10 - 24   Sodium 144 134 - 144 mmol/L   Potassium 4.7 3.5 - 5.2 mmol/L   Chloride 108 (H) 96 - 106 mmol/L   CO2 23 20 - 29 mmol/L   Calcium 9.2 8.6 - 10.2 mg/dL   Total Protein 6.4 6.0 - 8.5 g/dL   Albumin 3.7 (L) 3.8 - 4.8 g/dL   Globulin, Total 2.7 1.5 - 4.5 g/dL   Albumin/Globulin Ratio 1.4 1.2 - 2.2   Bilirubin Total 0.8 0.0 - 1.2 mg/dL   Alkaline Phosphatase 308 (H) 44 - 121 IU/L   AST 50 (H) 0 - 40 IU/L   ALT 58 (H) 0 - 44 IU/L  Lipid panel     Status: None   Collection Time: 12/01/20  9:16 AM  Result Value Ref Range   Cholesterol, Total 162 100 - 199 mg/dL   Triglycerides 78 0 - 149 mg/dL   HDL 67 >39 mg/dL   VLDL Cholesterol Cal 15 5 - 40 mg/dL   LDL Chol Calc (NIH) 80 0 - 99 mg/dL   Chol/HDL Ratio 2.4 0.0 - 5.0 ratio    Comment:                                   T. Chol/HDL Ratio                                             Men  Women                               1/2 Avg.Risk  3.4    3.3                                   Avg.Risk  5.0    4.4                                2X Avg.Risk  9.6    7.1  3X Avg.Risk 23.4   11.0   T4, free     Status: None   Collection Time: 12/01/20  9:16 AM  Result Value Ref Range   Free T4 1.35 0.82 - 1.77 ng/dL  TSH     Status: None   Collection Time: 12/01/20  9:16 AM  Result Value Ref Range   TSH 2.230 0.450 - 4.500 uIU/mL  POCT glycosylated hemoglobin (Hb A1C)     Status: Abnormal   Collection Time: 12/09/20 10:05 AM  Result Value Ref Range   Hemoglobin A1C     HbA1c POC (<> result, manual entry)     HbA1c, POC (prediabetic range)     HbA1c, POC (controlled diabetic range) 8.1 (A) 0.0  - 7.0 %  Ferritin     Status: Abnormal   Collection Time: 12/10/20  8:59 AM  Result Value Ref Range   Ferritin 12 (L) 30 - 400 ng/mL  Cortisol-am, blood     Status: None   Collection Time: 12/10/20  8:59 AM  Result Value Ref Range   Cortisol - AM 12.3 6.2 - 19.4 ug/dL    Assessment & Plan:   1) Diabetes mellitus with complication ([facility])- Type unspecified- likely type 1  - Patient has currently controlled asymptomatic diabetes diagnosed since 70 years of age.  He presents today, accompanied by his wife, with his CGM and logs showing increasing glycemic profile, both fasting and postprandially.  His POCT A1c today is 8.5%, progressively increasing from last visit of 7%.  Analysis of his CGM shows TIR 46%, TAR 53%, TBR  <2%.  He states he is eating more now, trying to gain weight.his cane 5 pounds since last visit.  He is average blood glucose is 186 over the last 14 days.   Patient denies any gross complications, however he remains at a high risk for more acute and chronic complications which include CAD, CVA, CKD, retinopathy, and neuropathy. These are all discussed in detail with the patient.  - Nutritional counseling repeated at each appointment due to patients tendency to fall back in to old habits.  - Suggestion is made for him to avoid simple carbohydrates  from his diet including Cakes, Sweet Desserts, Ice Cream, Soda (diet and regular), Sweet Tea, Candies, Chips, Cookies, Store Bought Juices, Alcohol in Excess of  1-2 drinks a day, Artificial Sweeteners,  Coffee Creamer, and "Sugar-free" Products, Lemonade. This will help patient to have more stable blood glucose profile and potentially avoid unintended weight gain. -He is encouraged to introduce more whole foods, minimally processed carbohydrates to help him maintain his weight   - I encouraged the patient to consume more plant-based protein, fruits and vegetables.    - Patient is advised to stick to a routine mealtimes to eat 3  meals a day and avoid unnecessary snacks (to snack only to correct hypoglycemia).  - I have approached patient with the following individualized plan to manage diabetes and patient agrees:   -He will continue to require intensive treatment with basal/bolus insulin in order for him to achieve and maintain control of diabetes to target.  He will continue to need mostly one-on-one assistance with meals and insulin administration.   -I discussed and increase his Tyler Aas to 30 units nightly, readjusted his NovoLog to 6-9  units TID with meals if glucose is above 90 and he is eating (Specific instructions on how to titrate insulin dosage based on glucose readings given to patient in writing).    -He is advised to  continue using his CGM to monitor glucose 4 times per day, before meals and at bedtime and call the clinic if blood glucose is below 70 or greater than 200 for 3 tests in a row.  -His CT scan is negative for any occult intra-abdominal malignancy or neoplasm.  His adrenal function is sufficient.  He has a low ferritin level indicating poor iron stores.  I discussed and prescribed ferrous sulfate 325 mg p.o. daily at breakfast.     - Patient is warned not to take insulin without proper monitoring per orders.  - Patient specific target  A1c;  LDL, HDL, Triglycerides,  were discussed in detail.  2) BP/HTN:  His blood pressure is marginally controlled at 92/66, better than last time.  He is on low-dose metoprolol 25 mg p.o. daily.     3) Lipids/HPL:  His recent lipid panel from 12/01/20 shows controlled LDL controlled at 80 .He is advised to continue Lipitor 40 mg po daily at bedtime.  Side effects and precautions discussed with him.  May need to stop this medication due to elevated LFTs in the future.  4)  Weight/Diet:  His Body mass index is 21.08 kg/m.- not a candidate for weight loss.   CDE Consult has been initiated , exercise, and detailed carbohydrates information provided. Continue  dietary plan with increased whole food, carbohydrate rich sources are discussed and recommended for him.  5) Chronic Care/Health Maintenance: -Patient is on Statin medications and encouraged to continue to follow up with Ophthalmology, Podiatrist at least yearly or according to recommendations, and advised to   stay away from smoking. I have recommended yearly flu vaccine and pneumonia vaccination at least every 5 years; moderate intensity exercise for up to 150 minutes weekly; and  sleep for at least 7 hours a day.   - I advised patient to maintain close follow up with Leeanne Rio, MD for primary care needs.    I spent 41 minutes in the care of the patient today including review of labs from [facility], Lipids, Thyroid Function, Hematology (current and previous including abstractions from other facilities); face-to-face time discussing  his blood glucose readings/logs, discussing hypoglycemia and hyperglycemia episodes and symptoms, medications doses, his options of short and long term treatment based on the latest standards of care / guidelines;  discussion about incorporating lifestyle medicine;  and documenting the encounter.    Please refer to Patient Instructions for Blood Glucose Monitoring and Insulin/Medications Dosing Guide"  in media tab for additional information. Please  also refer to " Patient Self Inventory" in the Media  tab for reviewed elements of pertinent patient history.  Heron Nay participated in the discussions, expressed understanding, and voiced agreement with the above plans.  All questions were answered to his satisfaction. he is encouraged to contact clinic should he have any questions or concerns prior to his return visit.    Follow up plan: - Return in about 3 months (around 04/11/2021) for F/U with Pre-visit Labs, Meter, Logs, A1c here.Rayetta Pigg, FNP-BC [facility] [address] [place], [place] [address] Phone:  [phone] Fax: [phone]  01/09/2021, 12:31 PM

## 2021-02-18 ENCOUNTER — Encounter (INDEPENDENT_AMBULATORY_CARE_PROVIDER_SITE_OTHER): Payer: Self-pay | Admitting: *Deleted

## 2021-02-19 ENCOUNTER — Ambulatory Visit (INDEPENDENT_AMBULATORY_CARE_PROVIDER_SITE_OTHER): Payer: Medicare Other | Admitting: Gastroenterology

## 2021-02-19 ENCOUNTER — Other Ambulatory Visit (INDEPENDENT_AMBULATORY_CARE_PROVIDER_SITE_OTHER): Payer: Self-pay

## 2021-02-19 ENCOUNTER — Encounter (INDEPENDENT_AMBULATORY_CARE_PROVIDER_SITE_OTHER): Payer: Self-pay | Admitting: Gastroenterology

## 2021-02-19 ENCOUNTER — Other Ambulatory Visit: Payer: Self-pay

## 2021-02-19 ENCOUNTER — Encounter (INDEPENDENT_AMBULATORY_CARE_PROVIDER_SITE_OTHER): Payer: Self-pay

## 2021-02-19 VITALS — BP 155/68 | HR 80 | Temp 98.3°F | Ht 73.0 in | Wt 160.8 lb

## 2021-02-19 DIAGNOSIS — D509 Iron deficiency anemia, unspecified: Secondary | ICD-10-CM

## 2021-02-19 DIAGNOSIS — K921 Melena: Secondary | ICD-10-CM | POA: Diagnosis not present

## 2021-02-19 MED ORDER — PANTOPRAZOLE SODIUM 40 MG PO TBEC: 40.0000 mg | DELAYED_RELEASE_TABLET | Freq: Every day | ORAL | 3 refills | Status: DC

## 2021-02-19 NOTE — Patient Instructions (Addendum)
Continue to hold your plavix and aspirin Please STOP aleve, Please avoid NSAIDs (advil, aleve, naproxen, goody powder, ibuprofen) as these can be very hard on your GI tract, causing inflammation, ulcers and damage to the lining of your GI tract.   I am sending protonix 40mg  to your pharmacy, please take this once a day, 30-45 minutes prior to eating in the morning. We will get you scheduled for upper endoscopy for further evaluation of your symptoms  If you develop, shortness of breath, dizziness, fatigue, rectal bleeding, heavier dark stools or you pass out, these are indications that you could have severely low blood counts and you need to proceed to the ER.

## 2021-02-19 NOTE — Progress Notes (Signed)
Referring Provider: Leeanne Rio, MD Primary Care Physician:  Leeanne Rio, MD Primary GI Physician: newly established  Chief Complaint  Patient presents with   Diarrhea    Diarrhea and dark stools for about one month. Averages about 2 stools a day.    HPI:   William Beck is a 70 y.o. male with past medical history of DM, HLD, HTN, Myasthenia gravis,   Patient presenting today as new patient, referred by Catalina Antigua for IDA, diarrhea and melena.   Patient reports melena x1 month, he was started on PO iron supplements by his endocrinologist. 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   He reports that about a month ago (mid November) he began having diarrhea and black stools, occurring twice daily. States that he went to Dr. Dorris Fetch, prior to that, on Nov 4th, and was told hgb was low as well as his iron, unsure of exact numbers. He was started on PO iron at that time. States that he then began having diarrhea and black stools shortly thereafter, he went to urgent care and was referred to the ED for further evaluation of black stools. States that he was given some fluids in ED and sent home, he did not receive any blood at that time as Hgb was not low enough for transfusion. He saw PCP Dr. Huel Cote and hgb was 8.6 on 12/13. Reports that appetite has been declining for the past few years. Previously weighed about 190 about 2 years ago, and now weighs 160. States that he had CT Abd and Pelvis in Oct without acute findings to indicate a cause of weight loss. He denies any abdominal pain, nausea or vomiting. He does take two aleve tablets every night for the past 6 months. Denies any bright red rectal bleeding. Denies fatigue shortness of breath and dizziness. Has had no hematemesis. He has no post prandial pain.   NSAID use:81mg  asa for hx of  cva, 2 aleve nightly x6 months Social hx:no etoh or tobacco  Fam hx:no crc or liver disease.   Last Colonoscopy:2020 Dr. Rocco Serene, normal Last Endoscopy:never  Past Medical History:  Diagnosis Date   Diabetes mellitus type I ([place])    Hyperlipidemia    Hypertension    Myasthenia gravis ([place])     Past Surgical History:  Procedure Laterality Date   BACK SURGERY      Current Outpatient Medications  Medication Sig Dispense Refill   atorvastatin (LIPITOR) 40 MG tablet Take 1 tablet by mouth daily.     chlorhexidine (PERIDEX) 0.12 % solution 15 mLs 2 (two) times daily.     Continuous Blood Gluc Sensor (FREESTYLE LIBRE 14 DAY SENSOR) MISC Inject 1 each into the skin every 14 (fourteen) days. Use as directed. 2 each 2   ferrous sulfate (FERROUSUL) 325 (65 FE) MG tablet Take 1 tablet (325 mg total) by mouth daily with breakfast. 90 tablet 1   insulin aspart (NOVOLOG FLEXPEN) 100 UNIT/ML FlexPen Inject 6-9 Units into the skin 3 (three) times daily with meals. 15 mL 1   insulin degludec (TRESIBA) 100 UNIT/ML FlexTouch Pen Inject 30 Units into the skin at bedtime.     Insulin Pen Needle (B-D ULTRAFINE III SHORT PEN) 31G X 8 MM MISC 1 each by Does not apply route as directed. 100 each 3  metoprolol tartrate (LOPRESSOR) 25 MG tablet Take 25 mg by mouth 2 (two) times daily.     mirtazapine (REMERON) 15 MG tablet Take 15 mg by mouth at bedtime.     montelukast (SINGULAIR) 10 MG tablet montelukast 10 mg tablet  TAKE 1 TABLET BY MOUTH EVERY DAY AT NIGHT     Multiple Vitamin (MULTIVITAMIN) capsule Take 1 capsule by mouth daily.     predniSONE (DELTASONE) 10 MG tablet Take 10 mg by mouth daily with breakfast.     pyridostigmine (MESTINON) 60 MG tablet Take 30 mg by mouth 3 (three) times daily.     No current facility-administered medications for this visit.    Allergies as of 02/19/2021   (No Known Allergies)    Family History  Problem Relation Age of Onset   Heart attack Mother    Stroke  Mother    Diabetes Father    Heart attack Brother     Social History   Socioeconomic History   Marital status: Married    Spouse name: Not on file   Number of children: Not on file   Years of education: Not on file   Highest education level: Not on file  Occupational History   Not on file  Tobacco Use   Smoking status: Never   Smokeless tobacco: Never  Vaping Use   Vaping Use: Never used  Substance and Sexual Activity   Alcohol use: No   Drug use: No   Sexual activity: Not on file  Other Topics Concern   Not on file  Social History Narrative   Not on file   Social Determinants of Health   Financial Resource Strain: Not on file  Food Insecurity: Not on file  Transportation Needs: Not on file  Physical Activity: Not on file  Stress: Not on file  Social Connections: Not on file   Review of systems General: negative for malaise, night sweats, fever, chills, +weight loss Neck: Negative for lumps, goiter, pain and significant neck swelling Resp: Negative for cough, wheezing, dyspnea at rest CV: Negative for chest pain, leg swelling, palpitations, orthopnea GI: denies hematochezia, nausea, vomiting, constipation, dysphagia, odyonophagia, early satiety. +unintentional weight loss. +melena +diarrhea MSK: Negative for joint pain or swelling, back pain, and muscle pain. Derm: Negative for itching or rash Psych: Denies depression, anxiety, memory loss, confusion. No homicidal or suicidal ideation.  Heme: Negative for prolonged bleeding, bruising easily, and swollen nodes. Endocrine: Negative for cold or heat intolerance, polyuria, polydipsia and goiter. Neuro: negative for tremor, gait imbalance, syncope and seizures. The remainder of the review of systems is noncontributory.  Physical Exam: BP (!) 155/68 (BP Location: Right Arm, Patient Position: Sitting, Cuff Size: Normal)    Pulse 80    Temp 98.3 F (36.8 C) (Oral)    Ht 6\' 1"  (1.854 m)    Wt 160 lb 12.8 oz (72.9 kg)     BMI 21.22 kg/m  General:   Alert and oriented. No distress noted. Pleasant and cooperative.  Head:  Normocephalic and atraumatic. Eyes:  Conjuctiva clear without scleral icterus. Mouth:  Oral mucosa pink and moist. Good dentition. No lesions. Heart: Normal rate and rhythm, s1 and s2 heart sounds present.  Lungs: Clear lung sounds in all lobes. Respirations equal and unlabored. Abdomen:  +BS, soft, non-tender and non-distended. No rebound or guarding. No HSM or masses noted. Derm: No palmar erythema or jaundice Msk:  Symmetrical without gross deformities. Normal posture. Extremities:  Without edema. Neurologic:  Alert and  oriented x4 Psych:  Alert and cooperative. Normal mood and affect.  Invalid input(s): 6 MONTHS   ASSESSMENT: William Beck is a 70 y.o. male presenting today for melena/diarrhea and anemia for the past month.   Patient continues to have melena with diarrhea twice a day, his plavix and asa are currently on hold, he has tried stopping his PO iron but continued to have dark stools. We will continue PO iron at this time given ongoing melena and anemia. He needs to stop aleve and avoid all other NSAIDs, I suspect he could have some NSAID induced gastritis or duodenitis given frequent NSAID use, however, we cannot rule out PUD, bleeding polyps, or malignancy,especially given his recent weight loss over the past 2 years. Last hgb was 8.6 on 12/13. BUN only slightly elevated at 21 on 12/2, not consistent with UGIB. I discussed s/s of severe/worsening anemia, he will proceed to the ER if he develops dizziness, rectal bleeding, worsening melena, fatigue, shortness of breath or syncope. Patient verbalized understanding.   Indications, risks and benefits of endoscopic procedure discussed in detail with patient and his wife who was present during the visit. Patient verbalized understanding and is in agreement to proceed with EGD and Colonoscopy.    PLAN:  Schedule  EGD+colonoscopy 2. Rx Pantoprazole 40mg  once daily  3. STOP aleve and avoid all other NSAIDs 4. Hold plavix and ASA for now 5. Continue PO  6. Monitor for s/s of worsening anemia, pt to proceed to the ER if he develops new or worsening GI symptoms, including dizziness, bleeding, fatigue, sob or syncope   Follow Up: TBD after EGD+colonoscopy  Margaruite Top L. Alver Sorrow, MSN, APRN, AGNP-C Adult-Gerontology Nurse Practitioner [facility]

## 2021-02-19 NOTE — H&P (View-Only) (Signed)
Referring Provider: Leeanne Rio, MD Primary Care Physician:  Leeanne Rio, MD Primary GI Physician: newly established  Chief Complaint  Patient presents with   Diarrhea    Diarrhea and dark stools for about one month. Averages about 2 stools a day.    HPI:   William Beck is a 70 y.o. male with past medical history of DM, HLD, HTN, Myasthenia gravis,   Patient presenting today as new patient, referred by Catalina Antigua for IDA, diarrhea and melena.   Patient reports melena x1 month, he was started on PO iron supplements by his endocrinologist. 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   He reports that about a month ago (mid November) he began having diarrhea and black stools, occurring twice daily. States that he went to Dr. Dorris Fetch, prior to that, on Nov 4th, and was told hgb was low as well as his iron, unsure of exact numbers. He was started on PO iron at that time. States that he then began having diarrhea and black stools shortly thereafter, he went to urgent care and was referred to the ED for further evaluation of black stools. States that he was given some fluids in ED and sent home, he did not receive any blood at that time as Hgb was not low enough for transfusion. He saw PCP Dr. Huel Cote and hgb was 8.6 on 12/13. Reports that appetite has been declining for the past few years. Previously weighed about 190 about 2 years ago, and now weighs 160. States that he had CT Abd and Pelvis in Oct without acute findings to indicate a cause of weight loss. He denies any abdominal pain, nausea or vomiting. He does take two aleve tablets every night for the past 6 months. Denies any bright red rectal bleeding. Denies fatigue shortness of breath and dizziness. Has had no hematemesis. He has no post prandial pain.   NSAID use:81mg  asa for hx of  cva, 2 aleve nightly x6 months Social hx:no etoh or tobacco  Fam hx:no crc or liver disease.   Last Colonoscopy:2020 Dr. Rocco Serene, normal Last Endoscopy:never  Past Medical History:  Diagnosis Date   Diabetes mellitus type I ([place])    Hyperlipidemia    Hypertension    Myasthenia gravis ([place])     Past Surgical History:  Procedure Laterality Date   BACK SURGERY      Current Outpatient Medications  Medication Sig Dispense Refill   atorvastatin (LIPITOR) 40 MG tablet Take 1 tablet by mouth daily.     chlorhexidine (PERIDEX) 0.12 % solution 15 mLs 2 (two) times daily.     Continuous Blood Gluc Sensor (FREESTYLE LIBRE 14 DAY SENSOR) MISC Inject 1 each into the skin every 14 (fourteen) days. Use as directed. 2 each 2   ferrous sulfate (FERROUSUL) 325 (65 FE) MG tablet Take 1 tablet (325 mg total) by mouth daily with breakfast. 90 tablet 1   insulin aspart (NOVOLOG FLEXPEN) 100 UNIT/ML FlexPen Inject 6-9 Units into the skin 3 (three) times daily with meals. 15 mL 1   insulin degludec (TRESIBA) 100 UNIT/ML FlexTouch Pen Inject 30 Units into the skin at bedtime.     Insulin Pen Needle (B-D ULTRAFINE III SHORT PEN) 31G X 8 MM MISC 1 each by Does not apply route as directed. 100 each 3  metoprolol tartrate (LOPRESSOR) 25 MG tablet Take 25 mg by mouth 2 (two) times daily.     mirtazapine (REMERON) 15 MG tablet Take 15 mg by mouth at bedtime.     montelukast (SINGULAIR) 10 MG tablet montelukast 10 mg tablet  TAKE 1 TABLET BY MOUTH EVERY DAY AT NIGHT     Multiple Vitamin (MULTIVITAMIN) capsule Take 1 capsule by mouth daily.     predniSONE (DELTASONE) 10 MG tablet Take 10 mg by mouth daily with breakfast.     pyridostigmine (MESTINON) 60 MG tablet Take 30 mg by mouth 3 (three) times daily.     No current facility-administered medications for this visit.    Allergies as of 02/19/2021   (No Known Allergies)    Family History  Problem Relation Age of Onset   Heart attack Mother    Stroke  Mother    Diabetes Father    Heart attack Brother     Social History   Socioeconomic History   Marital status: Married    Spouse name: Not on file   Number of children: Not on file   Years of education: Not on file   Highest education level: Not on file  Occupational History   Not on file  Tobacco Use   Smoking status: Never   Smokeless tobacco: Never  Vaping Use   Vaping Use: Never used  Substance and Sexual Activity   Alcohol use: No   Drug use: No   Sexual activity: Not on file  Other Topics Concern   Not on file  Social History Narrative   Not on file   Social Determinants of Health   Financial Resource Strain: Not on file  Food Insecurity: Not on file  Transportation Needs: Not on file  Physical Activity: Not on file  Stress: Not on file  Social Connections: Not on file   Review of systems General: negative for malaise, night sweats, fever, chills, +weight loss Neck: Negative for lumps, goiter, pain and significant neck swelling Resp: Negative for cough, wheezing, dyspnea at rest CV: Negative for chest pain, leg swelling, palpitations, orthopnea GI: denies hematochezia, nausea, vomiting, constipation, dysphagia, odyonophagia, early satiety. +unintentional weight loss. +melena +diarrhea MSK: Negative for joint pain or swelling, back pain, and muscle pain. Derm: Negative for itching or rash Psych: Denies depression, anxiety, memory loss, confusion. No homicidal or suicidal ideation.  Heme: Negative for prolonged bleeding, bruising easily, and swollen nodes. Endocrine: Negative for cold or heat intolerance, polyuria, polydipsia and goiter. Neuro: negative for tremor, gait imbalance, syncope and seizures. The remainder of the review of systems is noncontributory.  Physical Exam: BP (!) 155/68 (BP Location: Right Arm, Patient Position: Sitting, Cuff Size: Normal)    Pulse 80    Temp 98.3 F (36.8 C) (Oral)    Ht 6\' 1"  (1.854 m)    Wt 160 lb 12.8 oz (72.9 kg)     BMI 21.22 kg/m  General:   Alert and oriented. No distress noted. Pleasant and cooperative.  Head:  Normocephalic and atraumatic. Eyes:  Conjuctiva clear without scleral icterus. Mouth:  Oral mucosa pink and moist. Good dentition. No lesions. Heart: Normal rate and rhythm, s1 and s2 heart sounds present.  Lungs: Clear lung sounds in all lobes. Respirations equal and unlabored. Abdomen:  +BS, soft, non-tender and non-distended. No rebound or guarding. No HSM or masses noted. Derm: No palmar erythema or jaundice Msk:  Symmetrical without gross deformities. Normal posture. Extremities:  Without edema. Neurologic:  Alert and  oriented x4 Psych:  Alert and cooperative. Normal mood and affect.  Invalid input(s): 6 MONTHS   ASSESSMENT: William Beck is a 70 y.o. male presenting today for melena/diarrhea and anemia for the past month.   Patient continues to have melena with diarrhea twice a day, his plavix and asa are currently on hold, he has tried stopping his PO iron but continued to have dark stools. We will continue PO iron at this time given ongoing melena and anemia. He needs to stop aleve and avoid all other NSAIDs, I suspect he could have some NSAID induced gastritis or duodenitis given frequent NSAID use, however, we cannot rule out PUD, bleeding polyps, or malignancy,especially given his recent weight loss over the past 2 years. Last hgb was 8.6 on 12/13. BUN only slightly elevated at 21 on 12/2, not consistent with UGIB. I discussed s/s of severe/worsening anemia, he will proceed to the ER if he develops dizziness, rectal bleeding, worsening melena, fatigue, shortness of breath or syncope. Patient verbalized understanding.   Indications, risks and benefits of endoscopic procedure discussed in detail with patient and his wife who was present during the visit. Patient verbalized understanding and is in agreement to proceed with EGD and Colonoscopy.    PLAN:  Schedule  EGD+colonoscopy 2. Rx Pantoprazole 40mg  once daily  3. STOP aleve and avoid all other NSAIDs 4. Hold plavix and ASA for now 5. Continue PO  6. Monitor for s/s of worsening anemia, pt to proceed to the ER if he develops new or worsening GI symptoms, including dizziness, bleeding, fatigue, sob or syncope   Follow Up: TBD after EGD+colonoscopy  Raima Geathers L. Alver Sorrow, MSN, APRN, AGNP-C Adult-Gerontology Nurse Practitioner [facility]

## 2021-02-20 ENCOUNTER — Encounter (INDEPENDENT_AMBULATORY_CARE_PROVIDER_SITE_OTHER): Payer: Self-pay

## 2021-02-24 NOTE — Patient Instructions (Signed)
William Beck  02/24/2021     @PREFPERIOPPHARMACY @   Your procedure is scheduled on  03/03/2021.   Report to [facility] at  1200  P.M.   Call this number if you have problems the morning of surgery:  [phone]   Remember:  Follow the diet and prep instructions given to you by the office.    Take 15 units of your tresiba the night before your procedure.    Take these medicines the morning of surgery with A SIP OF WATER                  metoprolol, singulair, protonix, prednisone, mestinon.     Do not wear jewelry, make-up or nail polish.  Do not wear lotions, powders, or perfumes, or deodorant.  Do not shave 48 hours prior to surgery.  Men may shave face and neck.  Do not bring valuables to the hospital.  [facility] is not responsible for any belongings or valuables.  Contacts, dentures or bridgework may not be worn into surgery.  Leave your suitcase in the car.  After surgery it may be brought to your room.  For patients admitted to the hospital, discharge time will be determined by your treatment team.  Patients discharged the day of surgery will not be allowed to drive home and must have someone with them for 24 hours.    Special instructions:   DO NOT smoke tobacco or vape for 24 hours before your procedure.  Please read over the following fact sheets that you were given. Anesthesia Post-op Instructions and Care and Recovery After Surgery      Upper Endoscopy, Adult, Care After This sheet gives you information about how to care for yourself after your procedure. Your health care provider may also give you more specific instructions. If you have problems or questions, contact your health care provider. What can I expect after the procedure? After the procedure, it is common to have: A sore throat. Mild stomach pain or discomfort. Bloating. Nausea. Follow these instructions at home:  Follow instructions from your health care provider  about what to eat or drink after your procedure. Return to your normal activities as told by your health care provider. Ask your health care provider what activities are safe for you. Take over-the-counter and prescription medicines only as told by your health care provider. If you were given a sedative during the procedure, it can affect you for several hours. Do not drive or operate machinery until your health care provider says that it is safe. Keep all follow-up visits as told by your health care provider. This is important. Contact a health care provider if you have: A sore throat that lasts longer than one day. Trouble swallowing. Get help right away if: You vomit blood or your vomit looks like coffee grounds. You have: A fever. Bloody, black, or tarry stools. A severe sore throat or you cannot swallow. Difficulty breathing. Severe pain in your chest or abdomen. Summary After the procedure, it is common to have a sore throat, mild stomach discomfort, bloating, and nausea. If you were given a sedative during the procedure, it can affect you for several hours. Do not drive or operate machinery until your health care provider says that it is safe. Follow instructions from your health care provider about what to eat or drink after your procedure. Return to your normal activities as told by your health care provider. This information  is not intended to replace advice given to you by your health care provider. Make sure you discuss any questions you have with your health care provider. Document Revised: 12/29/2018 Document Reviewed: 07/25/2017 Elsevier Patient Education  2022 [place]. Colonoscopy, Adult, Care After This sheet gives you information about how to care for yourself after your procedure. Your health care provider may also give you more specific instructions. If you have problems or questions, contact your health care provider. What can I expect after the procedure? After  the procedure, it is common to have: A small amount of blood in your stool for 24 hours after the procedure. Some gas. Mild cramping or bloating of your abdomen. Follow these instructions at home: Eating and drinking  Drink enough fluid to keep your urine pale yellow. Follow instructions from your health care provider about eating or drinking restrictions. Resume your normal diet as instructed by your health care provider. Avoid heavy or fried foods that are hard to digest. Activity Rest as told by your health care provider. Avoid sitting for a long time without moving. Get up to take short walks every 1-2 hours. This is important to improve blood flow and breathing. Ask for help if you feel weak or unsteady. Return to your normal activities as told by your health care provider. Ask your health care provider what activities are safe for you. Managing cramping and bloating  Try walking around when you have cramps or feel bloated. Apply heat to your abdomen as told by your health care provider. Use the heat source that your health care provider recommends, such as a moist heat pack or a heating pad. Place a towel between your skin and the heat source. Leave the heat on for 20-30 minutes. Remove the heat if your skin turns bright red. This is especially important if you are unable to feel pain, heat, or cold. You may have a greater risk of getting burned. General instructions If you were given a sedative during the procedure, it can affect you for several hours. Do not drive or operate machinery until your health care provider says that it is safe. For the first 24 hours after the procedure: Do not sign important documents. Do not drink alcohol. Do your regular daily activities at a slower pace than normal. Eat soft foods that are easy to digest. Take over-the-counter and prescription medicines only as told by your health care provider. Keep all follow-up visits as told by your health care  provider. This is important. Contact a health care provider if: You have blood in your stool 2-3 days after the procedure. Get help right away if you have: More than a small spotting of blood in your stool. Large blood clots in your stool. Swelling of your abdomen. Nausea or vomiting. A fever. Increasing pain in your abdomen that is not relieved with medicine. Summary After the procedure, it is common to have a small amount of blood in your stool. You may also have mild cramping and bloating of your abdomen. If you were given a sedative during the procedure, it can affect you for several hours. Do not drive or operate machinery until your health care provider says that it is safe. Get help right away if you have a lot of blood in your stool, nausea or vomiting, a fever, or increased pain in your abdomen. This information is not intended to replace advice given to you by your health care provider. Make sure you discuss any questions you have  with your health care provider. Document Revised: 12/29/2018 Document Reviewed: 09/18/2018 Elsevier Patient Education  [place] After This sheet gives you information about how to care for yourself after your procedure. Your health care provider may also give you more specific instructions. If you have problems or questions, contact your health care provider. What can I expect after the procedure? After the procedure, it is common to have: Tiredness. Forgetfulness about what happened after the procedure. Impaired judgment for important decisions. Nausea or vomiting. Some difficulty with balance. Follow these instructions at home: For the time period you were told by your health care provider:   Rest as needed. Do not participate in activities where you could fall or become injured. Do not drive or use machinery. Do not drink alcohol. Do not take sleeping pills or medicines that cause drowsiness. Do not  make important decisions or sign legal documents. Do not take care of children on your own. Eating and drinking Follow the diet that is recommended by your health care provider. Drink enough fluid to keep your urine pale yellow. If you vomit: Drink water, juice, or soup when you can drink without vomiting. Make sure you have little or no nausea before eating solid foods. General instructions Have a responsible adult stay with you for the time you are told. It is important to have someone help care for you until you are awake and alert. Take over-the-counter and prescription medicines only as told by your health care provider. If you have sleep apnea, surgery and certain medicines can increase your risk for breathing problems. Follow instructions from your health care provider about wearing your sleep device: Anytime you are sleeping, including during daytime naps. While taking prescription pain medicines, sleeping medicines, or medicines that make you drowsy. Avoid smoking. Keep all follow-up visits as told by your health care provider. This is important. Contact a health care provider if: You keep feeling nauseous or you keep vomiting. You feel light-headed. You are still sleepy or having trouble with balance after 24 hours. You develop a rash. You have a fever. You have redness or swelling around the IV site. Get help right away if: You have trouble breathing. You have new-onset confusion at home. Summary For several hours after your procedure, you may feel tired. You may also be forgetful and have poor judgment. Have a responsible adult stay with you for the time you are told. It is important to have someone help care for you until you are awake and alert. Rest as told. Do not drive or operate machinery. Do not drink alcohol or take sleeping pills. Get help right away if you have trouble breathing, or if you suddenly become confused. This information is not intended to replace  advice given to you by your health care provider. Make sure you discuss any questions you have with your health care provider. Document Revised: 11/08/2019 Document Reviewed: 01/25/2019 Elsevier Patient Education  2022 [employer].

## 2021-02-25 ENCOUNTER — Encounter (HOSPITAL_COMMUNITY)
Admission: RE | Admit: 2021-02-25 | Discharge: 2021-02-25 | Disposition: A | Discharge status: Home / Self Care | Payer: Medicare Other | Source: Ambulatory Visit | Attending: Gastroenterology | Admitting: Gastroenterology

## 2021-02-25 ENCOUNTER — Encounter (HOSPITAL_COMMUNITY): Payer: Self-pay

## 2021-02-25 ENCOUNTER — Other Ambulatory Visit: Payer: Self-pay

## 2021-02-25 VITALS — BP 132/55 | HR 64 | Temp 98.3°F | Resp 18 | Ht 73.0 in | Wt 160.0 lb

## 2021-02-25 DIAGNOSIS — Z01818 Encounter for other preprocedural examination: Secondary | ICD-10-CM | POA: Diagnosis not present

## 2021-02-25 DIAGNOSIS — K921 Melena: Secondary | ICD-10-CM | POA: Insufficient documentation

## 2021-02-25 DIAGNOSIS — D649 Anemia, unspecified: Secondary | ICD-10-CM

## 2021-02-25 DIAGNOSIS — E109 Type 1 diabetes mellitus without complications: Secondary | ICD-10-CM | POA: Diagnosis not present

## 2021-02-25 DIAGNOSIS — D509 Iron deficiency anemia, unspecified: Secondary | ICD-10-CM

## 2021-02-25 HISTORY — DX: Cerebral infarction, unspecified: I63.9

## 2021-02-25 LAB — CBC WITH DIFFERENTIAL/PLATELET
Abs Immature Granulocytes: 0 10*3/uL (ref 0.00–0.07)
Basophils Absolute: 0 10*3/uL (ref 0.0–0.1)
Basophils Relative: 1 %
Eosinophils Absolute: 0.1 10*3/uL (ref 0.0–0.5)
Eosinophils Relative: 3 %
HCT: 27.7 % — ABNORMAL LOW (ref 39.0–52.0)
Hemoglobin: 8.2 g/dL — ABNORMAL LOW (ref 13.0–17.0)
Immature Granulocytes: 0 %
Lymphocytes Relative: 10 %
Lymphs Abs: 0.4 10*3/uL — ABNORMAL LOW (ref 0.7–4.0)
MCH: 26.2 pg (ref 26.0–34.0)
MCHC: 29.6 g/dL — ABNORMAL LOW (ref 30.0–36.0)
MCV: 88.5 fL (ref 80.0–100.0)
Monocytes Absolute: 0.5 10*3/uL (ref 0.1–1.0)
Monocytes Relative: 13 %
Neutro Abs: 2.7 10*3/uL (ref 1.7–7.7)
Neutrophils Relative %: 73 %
Platelets: 205 10*3/uL (ref 150–400)
RBC: 3.13 MIL/uL — ABNORMAL LOW (ref 4.22–5.81)
RDW: 15.7 % — ABNORMAL HIGH (ref 11.5–15.5)
WBC: 3.7 10*3/uL — ABNORMAL LOW (ref 4.0–10.5)
nRBC: 0 % (ref 0.0–0.2)

## 2021-02-25 LAB — BASIC METABOLIC PANEL
Anion gap: 7 (ref 5–15)
BUN: 28 mg/dL — ABNORMAL HIGH (ref 8–23)
CO2: 23 mmol/L (ref 22–32)
Calcium: 8.6 mg/dL — ABNORMAL LOW (ref 8.9–10.3)
Chloride: 108 mmol/L (ref 98–111)
Creatinine, Ser: 0.93 mg/dL (ref 0.61–1.24)
GFR, Estimated: >60 mL/min (ref >60)
Glucose, Bld: 178 mg/dL — ABNORMAL HIGH (ref 70–99)
Potassium: 3.7 mmol/L (ref 3.5–5.1)
Sodium: 138 mmol/L (ref 135–145)

## 2021-03-03 ENCOUNTER — Ambulatory Visit (HOSPITAL_COMMUNITY)
Admission: RE | Admit: 2021-03-03 | Discharge: 2021-03-03 | Disposition: A | Discharge status: Home / Self Care | Payer: Medicare Other | Source: Ambulatory Visit | Attending: Gastroenterology | Admitting: Gastroenterology

## 2021-03-03 ENCOUNTER — Encounter (HOSPITAL_COMMUNITY)
Admission: RE | Disposition: A | Discharge status: Home / Self Care | Payer: Self-pay | Source: Ambulatory Visit | Attending: Gastroenterology

## 2021-03-03 ENCOUNTER — Ambulatory Visit (HOSPITAL_COMMUNITY): Payer: Medicare Other | Admitting: Anesthesiology

## 2021-03-03 ENCOUNTER — Encounter (HOSPITAL_COMMUNITY): Payer: Self-pay | Admitting: Gastroenterology

## 2021-03-03 ENCOUNTER — Other Ambulatory Visit: Payer: Self-pay

## 2021-03-03 DIAGNOSIS — E109 Type 1 diabetes mellitus without complications: Secondary | ICD-10-CM | POA: Diagnosis not present

## 2021-03-03 DIAGNOSIS — Q438 Other specified congenital malformations of intestine: Secondary | ICD-10-CM | POA: Diagnosis not present

## 2021-03-03 DIAGNOSIS — D500 Iron deficiency anemia secondary to blood loss (chronic): Secondary | ICD-10-CM | POA: Insufficient documentation

## 2021-03-03 DIAGNOSIS — I10 Essential (primary) hypertension: Secondary | ICD-10-CM | POA: Diagnosis not present

## 2021-03-03 DIAGNOSIS — E785 Hyperlipidemia, unspecified: Secondary | ICD-10-CM | POA: Diagnosis not present

## 2021-03-03 DIAGNOSIS — K449 Diaphragmatic hernia without obstruction or gangrene: Secondary | ICD-10-CM | POA: Insufficient documentation

## 2021-03-03 DIAGNOSIS — R197 Diarrhea, unspecified: Secondary | ICD-10-CM | POA: Insufficient documentation

## 2021-03-03 DIAGNOSIS — K3189 Other diseases of stomach and duodenum: Secondary | ICD-10-CM | POA: Insufficient documentation

## 2021-03-03 DIAGNOSIS — K2950 Unspecified chronic gastritis without bleeding: Secondary | ICD-10-CM | POA: Diagnosis not present

## 2021-03-03 DIAGNOSIS — D509 Iron deficiency anemia, unspecified: Secondary | ICD-10-CM

## 2021-03-03 DIAGNOSIS — D122 Benign neoplasm of ascending colon: Secondary | ICD-10-CM | POA: Insufficient documentation

## 2021-03-03 DIAGNOSIS — K648 Other hemorrhoids: Secondary | ICD-10-CM | POA: Diagnosis not present

## 2021-03-03 DIAGNOSIS — G7000 Myasthenia gravis without (acute) exacerbation: Secondary | ICD-10-CM | POA: Diagnosis not present

## 2021-03-03 DIAGNOSIS — D649 Anemia, unspecified: Secondary | ICD-10-CM | POA: Diagnosis not present

## 2021-03-03 DIAGNOSIS — K921 Melena: Secondary | ICD-10-CM | POA: Insufficient documentation

## 2021-03-03 DIAGNOSIS — K644 Residual hemorrhoidal skin tags: Secondary | ICD-10-CM | POA: Diagnosis not present

## 2021-03-03 DIAGNOSIS — Z7982 Long term (current) use of aspirin: Secondary | ICD-10-CM | POA: Diagnosis not present

## 2021-03-03 DIAGNOSIS — Z8673 Personal history of transient ischemic attack (TIA), and cerebral infarction without residual deficits: Secondary | ICD-10-CM | POA: Diagnosis not present

## 2021-03-03 HISTORY — PX: POLYPECTOMY: SHX5525

## 2021-03-03 HISTORY — PX: COLONOSCOPY WITH PROPOFOL: SHX5780

## 2021-03-03 HISTORY — PX: BIOPSY: SHX5522

## 2021-03-03 HISTORY — PX: ESOPHAGOGASTRODUODENOSCOPY (EGD) WITH PROPOFOL: SHX5813

## 2021-03-03 LAB — GLUCOSE, CAPILLARY
Glucose-Capillary: 94 mg/dL (ref 70–99)
Glucose-Capillary: 110 mg/dL — ABNORMAL HIGH (ref 70–99)

## 2021-03-03 LAB — HM COLONOSCOPY

## 2021-03-03 SURGERY — COLONOSCOPY WITH PROPOFOL
Anesthesia: General

## 2021-03-03 MED ORDER — LACTATED RINGERS IV SOLN: INTRAVENOUS | Status: DC

## 2021-03-03 MED ORDER — LIDOCAINE HCL (CARDIAC) PF 50 MG/5ML IV SOSY
PREFILLED_SYRINGE | INTRAVENOUS | Status: DC | PRN
  Administered 2021-03-03: 50 mg via INTRAVENOUS

## 2021-03-03 MED ORDER — PROPOFOL 10 MG/ML IV BOLUS
INTRAVENOUS | Status: DC | PRN
  Administered 2021-03-03: 75 mg via INTRAVENOUS
  Administered 2021-03-03: 20 mg via INTRAVENOUS
  Administered 2021-03-03: 100 mg via INTRAVENOUS
  Administered 2021-03-03: 50 mg via INTRAVENOUS
  Administered 2021-03-03: 75 mg via INTRAVENOUS

## 2021-03-03 MED ORDER — PROPOFOL 500 MG/50ML IV EMUL
INTRAVENOUS | Status: DC | PRN
  Administered 2021-03-03: 75 ug/kg/min via INTRAVENOUS

## 2021-03-03 NOTE — Op Note (Addendum)
[facility] Patient Name: William Beck Procedure Date: 03/03/2021 1:06 PM MRN: [id number] Date of Birth: [birth year] Attending MD: Maylon Peppers ,  CSN: [id number] Age: 70 Admit Type: Outpatient Procedure:                Colonoscopy Indications:              Clinically significant diarrhea of unexplained                            origin Providers:                Maylon Peppers, Rosina Lowenstein, RN, Aram Candela Referring MD:              Medicines:                Monitored Anesthesia Care Complications:            No immediate complications. Estimated Blood Loss:     Estimated blood loss: none. Procedure:                Pre-Anesthesia Assessment:                           - Prior to the procedure, a History and Physical                            was performed, and patient medications, allergies                            and sensitivities were reviewed. The patient's                            tolerance of previous anesthesia was reviewed.                           - The risks and benefits of the procedure and the                            sedation options and risks were discussed with the                            patient. All questions were answered and informed                            consent was obtained.                           After obtaining informed consent, the colonoscope                            was passed under direct vision. Throughout the                            procedure, the patient's blood pressure, pulse, and                            oxygen saturations were monitored continuously. The  [id number]-HQ190L ([id number]) scope was introduced through                            the anus and advanced to the the cecum, identified                            by appendiceal orifice and ileocecal valve. The                            colonoscopy was technically difficult and complex                            due to a tortuous colon.  Successful completion of                            the procedure was aided by applying abdominal                            pressure. The patient tolerated the procedure well.                            The quality of the bowel preparation was good. Scope In: 1:10:16 PM Scope Out: 1:43:07 PM Scope Withdrawal Time: 0 hours 14 minutes 11 seconds  Total Procedure Duration: 0 hours 32 minutes 51 seconds  Findings:      Hemorrhoids were found on perianal exam.      A 8 mm polyp was found in the ascending colon. The polyp was sessile.       The polyp was removed with a cold snare. Resection and retrieval were       complete.      The colon (entire examined portion) was significantly tortuous.       Intermittent abdominal pressure and reducing helped advancing the scope.       Biopsies for histology were taken with a cold forceps from the right       colon and left colon for evaluation of microscopic colitis.      Non-bleeding external internal hemorrhoids were found during       retroflexion. The hemorrhoids were small. Impression:               - Hemorrhoids found on perianal exam.                           - One 8 mm polyp in the ascending colon, removed                            with a cold snare. Resected and retrieved.                           - Tortuous colon. Biopsied.                           - Non-bleeding external internal hemorrhoids. Moderate Sedation:      Per Anesthesia Care Recommendation:           - Discharge patient to home (ambulatory).                           -  Resume previous diet.                           - Await pathology results.                           - Repeat colonoscopy date to be determined after                            pending pathology results are reviewed for                            surveillance.                           - Return to clinic in 3 months - will check repeat                            CBC and iron stores at that time.                            [facility] every day. Procedure Code(s):        --- Professional ---                           [phone], Colonoscopy, flexible; with removal of                            tumor(s), polyp(s), or other lesion(s) by snare                            technique                           [id number], 68, Colonoscopy, flexible; with biopsy,                            single or multiple Diagnosis Code(s):        --- Professional ---                           K64.4, Residual hemorrhoidal skin tags                           K64.8, Other hemorrhoids                           K63.5, Polyp of colon                           R19.7, Diarrhea, unspecified                           Q43.8, Other specified congenital malformations of                            intestine CPT copyright 2019 American Medical Association. All rights reserved. The  codes documented in this report are preliminary and upon coder review may  be revised to meet current compliance requirements. Maylon Peppers, MD Maylon Peppers,  03/03/2021 1:48:48 PM This report has been signed electronically. Number of Addenda: 0

## 2021-03-03 NOTE — Interval H&P Note (Signed)
History and Physical Interval Note:  03/03/2021 12:26 PM  William Beck  has presented today for surgery, with the diagnosis of Anemia Melena.  The various methods of treatment have been discussed with the patient and family. After consideration of risks, benefits and other options for treatment, the patient has consented to  Procedure(s) with comments: COLONOSCOPY WITH PROPOFOL (N/A) - 1:30 ESOPHAGOGASTRODUODENOSCOPY (EGD) WITH PROPOFOL (N/A) as a surgical intervention.  The patient's history has been reviewed, patient examined, no change in status, stable for surgery.  I have reviewed the patient's chart and labs.  Questions were answered to the patient's satisfaction.     [name]

## 2021-03-03 NOTE — Addendum Note (Signed)
Addendum  created 03/03/21 1455 by Vista Deck, CRNA   Charge Capture section accepted

## 2021-03-03 NOTE — Anesthesia Preprocedure Evaluation (Signed)
Anesthesia Evaluation  Patient identified by MRN, date of birth, ID band Patient awake    Reviewed: Allergy & Precautions, H&P , NPO status , Patient's Chart, lab work & pertinent test results, reviewed documented beta blocker date and time   Airway Mallampati: II  TM Distance: >3 FB Neck ROM: full    Dental no notable dental hx.    Pulmonary neg pulmonary ROS,    Pulmonary exam normal breath sounds clear to auscultation       Cardiovascular Exercise Tolerance: Good hypertension, negative cardio ROS   Rhythm:regular Rate:Normal     Neuro/Psych  Neuromuscular disease CVA negative psych ROS   GI/Hepatic negative GI ROS, Neg liver ROS,   Endo/Other  negative endocrine ROSdiabetes, Type 1  Renal/GU negative Renal ROS  negative genitourinary   Musculoskeletal   Abdominal   Peds  Hematology negative hematology ROS (+)   Anesthesia Other Findings   Reproductive/Obstetrics negative OB ROS                             Anesthesia Physical Anesthesia Plan  ASA: 3  Anesthesia Plan: General   Post-op Pain Management:    Induction:   PONV Risk Score and Plan: Propofol infusion  Airway Management Planned:   Additional Equipment:   Intra-op Plan:   Post-operative Plan:   Informed Consent: I have reviewed the patients History and Physical, chart, labs and discussed the procedure including the risks, benefits and alternatives for the proposed anesthesia with the patient or authorized representative who has indicated his/her understanding and acceptance.     Dental Advisory Given  Plan Discussed with: CRNA  Anesthesia Plan Comments:         Anesthesia Quick Evaluation

## 2021-03-03 NOTE — Anesthesia Postprocedure Evaluation (Signed)
Anesthesia Post Note  Patient: William Beck  Procedure(s) Performed: COLONOSCOPY WITH PROPOFOL ESOPHAGOGASTRODUODENOSCOPY (EGD) WITH PROPOFOL BIOPSY POLYPECTOMY  Patient location during evaluation: Phase II Anesthesia Type: General Level of consciousness: awake Pain management: pain level controlled Vital Signs Assessment: post-procedure vital signs reviewed and stable Respiratory status: spontaneous breathing and respiratory function stable Cardiovascular status: blood pressure returned to baseline and stable Postop Assessment: no headache and no apparent nausea or vomiting Anesthetic complications: no Comments: Late entry   No notable events documented.   Last Vitals:  Vitals:   03/03/21 1217 03/03/21 1348  BP: (!) 143/74 (!) 120/49  Pulse: (!) 59 70  Resp: 11 16  Temp: 36.5 C 36.6 C  SpO2: 100% 100%    Last Pain:  Vitals:   03/03/21 1348  TempSrc: Oral  PainSc: [place]

## 2021-03-03 NOTE — Transfer of Care (Signed)
Immediate Anesthesia Transfer of Care Note  Patient: William Beck  Procedure(s) Performed: COLONOSCOPY WITH PROPOFOL ESOPHAGOGASTRODUODENOSCOPY (EGD) WITH PROPOFOL BIOPSY POLYPECTOMY  Patient Location: Short Stay  Anesthesia Type:General  Level of Consciousness: awake and patient cooperative  Airway & Oxygen Therapy: Patient Spontanous Breathing  Post-op Assessment: Report given to RN and Post -op Vital signs reviewed and stable  Post vital signs: Reviewed and stable  Last Vitals:  Vitals Value Taken Time  BP    Temp    Pulse    Resp    SpO2      Last Pain:  Vitals:   03/03/21 1245  TempSrc:   PainSc: 0-No pain         Complications: No notable events documented.

## 2021-03-03 NOTE — Anesthesia Procedure Notes (Signed)
Date/Time: 03/03/2021 12:44 PM Performed by: Vista Deck, CRNA Pre-anesthesia Checklist: Patient identified, Emergency Drugs available, Suction available, Timeout performed and Patient being monitored Patient Re-evaluated:Patient Re-evaluated prior to induction Oxygen Delivery Method: Nasal Cannula

## 2021-03-03 NOTE — Discharge Instructions (Addendum)
You are being discharged to home.  Resume your previous diet.  We are waiting for your pathology results.  Do not take any ibuprofen (including Advil, Motrin or Nuprin), naproxen, or other non-steroidal anti-inflammatory drugs.  Return to clinic in 3 months - will check repeat CBC and iron stores at that time. Your physician has recommended a repeat colonoscopy (date to be determined after pending pathology results are reviewed) for surveillance.  Return to clinic in 3 months - will check repeat CBC and iron stores at that time. Start Benefiber every day.

## 2021-03-03 NOTE — Op Note (Addendum)
[facility] Patient Name: William Beck Procedure Date: 03/03/2021 12:32 PM MRN: [id number] Date of Birth: 01-11-1951 Attending MD: Maylon Peppers ,  CSN: [id number] Age: 70 Admit Type: Outpatient Procedure:                Upper GI endoscopy Indications:              Melena, Anemia Providers:                Maylon Peppers, Rosina Lowenstein, RN, Aram Candela Referring MD:              Medicines:                Monitored Anesthesia Care Complications:            No immediate complications. Estimated Blood Loss:     Estimated blood loss: none. Procedure:                Pre-Anesthesia Assessment:                           - Prior to the procedure, a History and Physical                            was performed, and patient medications, allergies                            and sensitivities were reviewed. The patient's                            tolerance of previous anesthesia was reviewed.                           - The risks and benefits of the procedure and the                            sedation options and risks were discussed with the                            patient. All questions were answered and informed                            consent was obtained.                           After obtaining informed consent, the endoscope was                            passed under direct vision. Throughout the                            procedure, the patient's blood pressure, pulse, and                            oxygen saturations were monitored continuously. The                            [id number]-[id number] ([id number]) scope was  introduced through the                            mouth, and advanced to the second part of duodenum.                            The upper GI endoscopy was accomplished without                            difficulty. The patient tolerated the procedure                            well. Scope In: 12:54:53 PM Scope Out: 1:05:27 PM Total Procedure Duration: 0 hours 10  minutes 34 seconds  Findings:      A 1 cm hiatal hernia was present.      The exam of the esophagus was otherwise normal.      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      Localized mildly erythematous mucosa without bleeding was found in the       gastric antrum. Biopsies were taken with a cold forceps for Helicobacter       pylori testing.      The examined duodenum was normal. Biopsies were taken with a cold       forceps for histology. Impression:               - 1 cm hiatal hernia.                           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                           - Erythematous mucosa in the antrum. Biopsied.                           - Normal examined duodenum. Biopsied. Moderate Sedation:      Per Anesthesia Care Recommendation:           - Discharge patient to home (ambulatory).                           - Resume previous diet.                           - Await pathology results.                           - No ibuprofen, naproxen, or other non-steroidal                            anti-inflammatory drugs. Procedure Code(s):        --- Professional ---                           [phone], Esophagogastroduodenoscopy, flexible,                            transoral; with biopsy, single or multiple Diagnosis  Code(s):        --- Professional ---                           K44.9, Diaphragmatic hernia without obstruction or                            gangrene                           K31.89, Other diseases of stomach and duodenum                           K92.1, Melena (includes Hematochezia)                           D64.9, Anemia, unspecified CPT copyright 2019 American Medical Association. All rights reserved. The codes documented in this report are preliminary and upon coder review may  be revised to meet current compliance requirements. Maylon Peppers, MD Maylon Peppers,  03/03/2021 1:44:37 PM This report has been signed electronically. Number of  Addenda: 0

## 2021-03-05 ENCOUNTER — Encounter (HOSPITAL_COMMUNITY): Payer: Self-pay | Admitting: Gastroenterology

## 2021-03-05 ENCOUNTER — Encounter (INDEPENDENT_AMBULATORY_CARE_PROVIDER_SITE_OTHER): Payer: Self-pay | Admitting: *Deleted

## 2021-03-05 LAB — SURGICAL PATHOLOGY

## 2021-03-18 ENCOUNTER — Other Ambulatory Visit: Payer: Self-pay | Admitting: Nurse Practitioner

## 2021-04-08 LAB — COMPREHENSIVE METABOLIC PANEL
ALT: 49 IU/L — ABNORMAL HIGH (ref 0–44)
AST: 49 IU/L — ABNORMAL HIGH (ref 0–40)
Albumin/Globulin Ratio: 1.4 (ref 1.2–2.2)
Albumin: 3.7 g/dL — ABNORMAL LOW (ref 3.8–4.8)
Alkaline Phosphatase: 278 IU/L — ABNORMAL HIGH (ref 44–121)
BUN/Creatinine Ratio: 14 (ref 10–24)
BUN: 15 mg/dL (ref 8–27)
Bilirubin Total: 1.1 mg/dL (ref 0.0–1.2)
CO2: 23 mmol/L (ref 20–29)
Calcium: 8.8 mg/dL (ref 8.6–10.2)
Chloride: 103 mmol/L (ref 96–106)
Creatinine, Ser: 1.05 mg/dL (ref 0.76–1.27)
Globulin, Total: 2.7 g/dL (ref 1.5–4.5)
Glucose: 82 mg/dL (ref 70–99)
Potassium: 3.9 mmol/L (ref 3.5–5.2)
Sodium: 143 mmol/L (ref 134–144)
Total Protein: 6.4 g/dL (ref 6.0–8.5)
eGFR: 77 mL/min/{1.73_m2} (ref >59)

## 2021-04-08 LAB — CBC WITH DIFFERENTIAL/PLATELET
Basophils Absolute: 0 10*3/uL (ref 0.0–0.2)
Basos: 1 %
EOS (ABSOLUTE): 0.3 10*3/uL (ref 0.0–0.4)
Eos: 7 %
Hematocrit: 37.2 % — ABNORMAL LOW (ref 37.5–51.0)
Hemoglobin: 11.4 g/dL — ABNORMAL LOW (ref 13.0–17.7)
Immature Grans (Abs): 0 10*3/uL (ref 0.0–0.1)
Immature Granulocytes: 0 %
Lymphocytes Absolute: 0.6 10*3/uL — ABNORMAL LOW (ref 0.7–3.1)
Lymphs: 15 %
MCH: 27.3 pg (ref 26.6–33.0)
MCHC: 30.6 g/dL — ABNORMAL LOW (ref 31.5–35.7)
MCV: 89 fL (ref 79–97)
Monocytes Absolute: 0.6 10*3/uL (ref 0.1–0.9)
Monocytes: 14 %
Neutrophils Absolute: 2.5 10*3/uL (ref 1.4–7.0)
Neutrophils: 63 %
Platelets: 204 10*3/uL (ref 150–450)
RBC: 4.18 x10E6/uL (ref 4.14–5.80)
RDW: 19.7 % — ABNORMAL HIGH (ref 11.6–15.4)
WBC: 3.9 10*3/uL (ref 3.4–10.8)

## 2021-04-08 LAB — FERRITIN: Ferritin: 32 ng/mL (ref 30–400)

## 2021-04-14 ENCOUNTER — Other Ambulatory Visit: Payer: Self-pay

## 2021-04-14 ENCOUNTER — Encounter: Payer: Self-pay | Admitting: "Endocrinology

## 2021-04-14 ENCOUNTER — Ambulatory Visit (INDEPENDENT_AMBULATORY_CARE_PROVIDER_SITE_OTHER): Payer: Medicare Other | Admitting: "Endocrinology

## 2021-04-14 VITALS — BP 122/68 | HR 72 | Ht 73.0 in | Wt 164.6 lb

## 2021-04-14 DIAGNOSIS — I10 Essential (primary) hypertension: Secondary | ICD-10-CM

## 2021-04-14 DIAGNOSIS — E782 Mixed hyperlipidemia: Secondary | ICD-10-CM

## 2021-04-14 DIAGNOSIS — E109 Type 1 diabetes mellitus without complications: Secondary | ICD-10-CM | POA: Diagnosis not present

## 2021-04-14 DIAGNOSIS — K8681 Exocrine pancreatic insufficiency: Secondary | ICD-10-CM | POA: Diagnosis not present

## 2021-04-14 LAB — POCT GLYCOSYLATED HEMOGLOBIN (HGB A1C): HbA1c, POC (controlled diabetic range): 6 % (ref 0.0–7.0)

## 2021-04-14 MED ORDER — NOVOLOG FLEXPEN 100 UNIT/ML ~~LOC~~ SOPN: 5.0000 [IU] | PEN_INJECTOR | Freq: Three times a day (TID) | SUBCUTANEOUS | 1 refills | Status: DC

## 2021-04-14 MED ORDER — CREON 24000-76000 UNITS PO CPEP: 1.0000 | ORAL_CAPSULE | Freq: Three times a day (TID) | ORAL | 1 refills | Status: DC

## 2021-04-14 NOTE — Progress Notes (Signed)
04/14/2021  Endocrinology Follow-up Note   Subjective:    Patient ID: William Beck, male    DOB: 07-29-1950.  He is being seen in follow-up for the management of his type 1 diabetes, hyperlipidemia, hypertension.    PMD:  Leeanne Rio, MD  Past Medical History:  Diagnosis Date   Diabetes mellitus type I ([place])    Hyperlipidemia    Hypertension    Myasthenia gravis ([place])    Stroke [facility])    Past Surgical History:  Procedure Laterality Date   BACK SURGERY     lumbar   BIOPSY  03/03/2021   Procedure: BIOPSY;  Surgeon: Harvel Quale, MD;  Location: [facility];  Service: Gastroenterology;;   COLONOSCOPY WITH PROPOFOL N/A 03/03/2021   Procedure: COLONOSCOPY WITH PROPOFOL;  Surgeon: Harvel Quale, MD;  Location: [facility];  Service: Gastroenterology;  Laterality: N/A;  1:30   ESOPHAGOGASTRODUODENOSCOPY (EGD) WITH PROPOFOL N/A 03/03/2021   Procedure: ESOPHAGOGASTRODUODENOSCOPY (EGD) WITH PROPOFOL;  Surgeon: Harvel Quale, MD;  Location[facility];  Service: Gastroenterology;  Laterality: N/A;   POLYPECTOMY  03/03/2021   Procedure: POLYPECTOMY;  Surgeon: Montez Morita, Quillian Quince, MD;  Location: [facility];  Service: Gastroenterology;;   Social History   Socioeconomic History   Marital status: Married    Spouse name: Not on file   Number of children: Not on file   Years of education: Not on file   Highest education level: Not on file  Occupational History   Not on file  Tobacco Use   Smoking status: Never   Smokeless tobacco: Never  Vaping Use   Vaping Use: Never used  Substance and Sexual Activity   Alcohol use: No   Drug use: No   Sexual activity: Not on file  Other Topics Concern   Not on file  Social History Narrative   Not on file   Social Determinants of Health   Financial Resource Strain: Not on file  Food Insecurity: Not on file  Transportation Needs: Not on file  Physical Activity: Not on file   Stress: Not on file  Social Connections: Not on file   Outpatient Encounter Medications as of 04/14/2021  Medication Sig   Pancrelipase, Lip-Prot-Amyl, (CREON) 24000-76000 units CPEP Take 1 capsule (24,000 Units total) by mouth 3 (three) times daily with meals.   aspirin EC 81 MG tablet Take 81 mg by mouth daily. Swallow whole. (Patient not taking: Reported on 04/14/2021)   atorvastatin (LIPITOR) 40 MG tablet Take 40 mg by mouth daily.   clopidogrel (PLAVIX) 75 MG tablet Take 75 mg by mouth daily. (Patient not taking: Reported on 04/14/2021)   Continuous Blood Gluc Sensor (FREESTYLE LIBRE 14 DAY SENSOR) MISC Inject 1 each into the skin every 14 (fourteen) days. Use as directed.   ferrous sulfate (FERROUSUL) 325 (65 FE) MG tablet Take 1 tablet (325 mg total) by mouth daily with breakfast.   insulin aspart (NOVOLOG FLEXPEN) 100 UNIT/ML FlexPen Inject 5-8 Units into the skin 3 (three) times daily with meals.   insulin degludec (TRESIBA) 100 UNIT/ML FlexTouch Pen Inject 24 Units into the skin at bedtime.   Insulin Pen Needle (B-D ULTRAFINE III SHORT PEN) 31G X 8 MM MISC 1 each by Does not apply route as directed.   loperamide (IMODIUM A-D) 2 MG tablet Take 2-4 mg by mouth 4 (four) times daily as needed for diarrhea or loose stools. (Patient not taking: Reported on 04/14/2021)   metoprolol tartrate (LOPRESSOR) 25 MG  tablet Take 25 mg by mouth 2 (two) times daily.   mirtazapine (REMERON) 15 MG tablet Take 15 mg by mouth at bedtime.   montelukast (SINGULAIR) 10 MG tablet Take 10 mg by mouth at bedtime.   Multiple Vitamin (MULTIVITAMIN) capsule Take 1 capsule by mouth daily.   pantoprazole (PROTONIX) 40 MG tablet Take 1 tablet (40 mg total) by mouth daily.   predniSONE (DELTASONE) 10 MG tablet Take 10 mg by mouth daily with breakfast.   Probiotic Product (PROBIOTIC PO) Take 1 capsule by mouth daily.   pyridostigmine (MESTINON) 60 MG tablet Take 30 mg by mouth 3 (three) times daily.   [DISCONTINUED] insulin  aspart (NOVOLOG FLEXPEN) 100 UNIT/ML FlexPen Inject 6-9 Units into the skin 3 (three) times daily with meals.   No facility-administered encounter medications on file as of 04/14/2021.   ALLERGIES: No Known Allergies VACCINATION STATUS: Immunization History  Administered Date(s) Administered   Moderna Sars-Covid-2 Vaccination 02/11/2020     Diabetes He presents for his follow-up diabetic visit. He has type 1 diabetes mellitus. Onset time: He was diagnosed at approximate age of 71 years. His disease course has been improving. Pertinent negatives for hypoglycemia include no confusion, headaches, hunger, nervousness/anxiousness, pallor, seizures, sweats or tremors. Pertinent negatives for diabetes include no chest pain, no fatigue, no polydipsia, no polyphagia, no polyuria, no weakness and no weight loss. There are no hypoglycemic complications. (He has no precipitating symptoms, his wife notices it) Symptoms are worsening. There are no diabetic complications. Risk factors for coronary artery disease include diabetes mellitus, dyslipidemia, hypertension, male sex and sedentary lifestyle. Current diabetic treatment includes intensive insulin program. He is compliant with treatment most of the time. His weight is increasing steadily. He is following a diabetic diet. When asked about meal planning, he reported none. He has not had a previous visit with a dietitian. He participates in exercise intermittently. His home blood glucose trend is decreasing steadily. His breakfast blood glucose range is generally 140-180 mg/dl. His lunch blood glucose range is generally 140-180 mg/dl. His dinner blood glucose range is generally 140-180 mg/dl. His bedtime blood glucose range is generally 140-180 mg/dl. His overall blood glucose range is 140-180 mg/dl. (William Beck presents with tight glycemic profile.  54% hemorrhage, 25% above range, 12% hypoglycemia.  His point-of-care A1c is 6%.  ) An ACE inhibitor/angiotensin II  receptor blocker is being taken. He sees a podiatrist.Eye exam is current.  Hyperlipidemia This is a chronic problem. The current episode started more than 1 year ago. The problem is controlled. Recent lipid tests were reviewed and are normal. Exacerbating diseases include diabetes. He has no history of chronic renal disease. Factors aggravating his hyperlipidemia include fatty foods. Pertinent negatives include no chest pain, myalgias or shortness of breath. Current antihyperlipidemic treatment includes statins. The current treatment provides moderate improvement of lipids. Compliance problems include adherence to diet and adherence to exercise.  Risk factors for coronary artery disease include diabetes mellitus, dyslipidemia, hypertension, a sedentary lifestyle and male sex.  Hypertension This is a chronic problem. The current episode started more than 1 year ago. The problem has been gradually improving since onset. The problem is controlled. Pertinent negatives include no chest pain, headaches, neck pain, palpitations, shortness of breath or sweats. There are no associated agents to hypertension. Risk factors for coronary artery disease include dyslipidemia, diabetes mellitus and male gender. Past treatments include beta blockers. The current treatment provides moderate improvement. There are no compliance problems.  There is no history of chronic  renal disease.   Review of systems  Constitutional: +steadily decreasing body weight, current Body mass index is 21.72 kg/m. , no fatigue, no subjective hyperthermia, no subjective hypothermia    Objective:    BP 122/68    Pulse 72    Ht $R'6\' 1"'uh$  (1.854 m)    Wt 164 lb 9.6 oz (74.7 kg)    BMI 21.72 kg/m   Wt Readings from Last 3 Encounters:  04/14/21 164 lb 9.6 oz (74.7 kg)  02/25/21 160 lb (72.6 kg)  02/19/21 160 lb 12.8 oz (72.9 kg)    BP Readings from Last 3 Encounters:  04/14/21 122/68  03/03/21 (!) 120/49  02/25/21 (!) 132/55    Physical  Exam- Limited  Constitutional:  Body mass index is 21.72 kg/m. , not in acute distress, normal state of mind Eyes:  + Pale conjunctivae, EOMI, no exophthalmos Neck: Supple Respiratory: Adequate breathing efforts Musculoskeletal: right foot deformity (from previous fracture), strength intact in all four extremities, no gross restriction of joint movements Skin:  no rashes, no hyperemia Neurological: no tremor with outstretched hands   Anti-islet antibodies from 11/25/2016 where negative.  Recent Results (from the past 2160 hour(s))  CBC with Differential/Platelet     Status: Abnormal   Collection Time: 02/25/21  3:23 PM  Result Value Ref Range   WBC 3.7 (L) 4.0 - 10.5 K/uL   RBC 3.13 (L) 4.22 - 5.81 MIL/uL   Hemoglobin 8.2 (L) 13.0 - 17.0 g/dL   HCT 27.7 (L) 39.0 - 52.0 %   MCV 88.5 80.0 - 100.0 fL   MCH 26.2 26.0 - 34.0 pg   MCHC 29.6 (L) 30.0 - 36.0 g/dL   RDW 15.7 (H) 11.5 - 15.5 %   Platelets 205 150 - 400 K/uL   nRBC 0.0 0.0 - 0.2 %   Neutrophils Relative % 73 %   Neutro Abs 2.7 1.7 - 7.7 K/uL   Lymphocytes Relative 10 %   Lymphs Abs 0.4 (L) 0.7 - 4.0 K/uL   Monocytes Relative 13 %   Monocytes Absolute 0.5 0.1 - 1.0 K/uL   Eosinophils Relative 3 %   Eosinophils Absolute 0.1 0.0 - 0.5 K/uL   Basophils Relative 1 %   Basophils Absolute 0.0 0.0 - 0.1 K/uL   Immature Granulocytes 0 %   Abs Immature Granulocytes 0.00 0.00 - 0.07 K/uL    Comment: Performed at [facility], [address], [place], [place] [address]  Basic metabolic panel     Status: Abnormal   Collection Time: 02/25/21  3:23 PM  Result Value Ref Range   Sodium 138 135 - 145 mmol/L   Potassium 3.7 3.5 - 5.1 mmol/L   Chloride 108 98 - 111 mmol/L   CO2 23 22 - 32 mmol/L   Glucose, Bld 178 (H) 70 - 99 mg/dL    Comment: Glucose reference range applies only to samples taken after fasting for at least 8 hours.   BUN 28 (H) 8 - 23 mg/dL   Creatinine, Ser 0.93 0.61 - 1.24 mg/dL   Calcium 8.6 (L) 8.9 - 10.3  mg/dL   GFR, Estimated >60 >60 mL/min    Comment: (NOTE) Calculated using the CKD-EPI Creatinine Equation (2021)    Anion gap 7 5 - 15    Comment: Performed at [facility], [address], [place], [place] [address]  HM COLONOSCOPY     Status: None   Collection Time: 03/03/21 12:00 AM  Result Value Ref Range   HM Colonoscopy  See Report (in chart) See Report (in chart), Patient Reported  Glucose, capillary     Status: None   Collection Time: 03/03/21 12:33 PM  Result Value Ref Range   Glucose-Capillary 94 70 - 99 mg/dL    Comment: Glucose reference range applies only to samples taken after fasting for at least 8 hours.  Surgical pathology     Status: None   Collection Time: 03/03/21  1:00 PM  Result Value Ref Range   SURGICAL PATHOLOGY      SURGICAL PATHOLOGY CASE: [id number] PATIENT: William Beck Surgical Pathology Report     Clinical History: Anemia melena; gastritis, hemorrhoids (crm)     FINAL MICROSCOPIC DIAGNOSIS:  A. SMALL BOWEL, BIOPSY: - Enteric mucosa with normal villous architecture and no significant histopathologic change. - Negative for features of celiac, dysplasia, and malignancy.  B. 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 - Negative for H. pylori, dysplasia, and malignancy. - See comment.  C. COLON, RANDOM, BIOPSY: - Benign colonic mucosa with no significant histopathologic change. - Negative for features of microscopic colitis. - Negative for dysplasia and malignancy.  D. COLON, ASCENDING, POLYPECTOMY: - Tubular adenoma. - Negative for high-grade dysplasia and malignancy.  COMMENT:  B. A Warthin-Starry special stain is negative for Helicobacter pylori type organisms.   GROSS DESCRIPTION:  A: Received in formalin are tan , soft tissue fragments that are submitted in toto. Number: 4 size: Range from 0.2 to 0.4 cm blocks: 1  B: Received in formalin are tan, soft tissue fragments that  are submitted in toto. Number: Multiple size: Range from 0.2 to 0.5 cm blocks: 1  C: Received in formalin are tan, soft tissue fragments that are submitted in toto. Number: Multiple size: Range from 0.2 to 0.4 cm blocks: 1  D: Received in formalin are tan, soft tissue fragments that are submitted in toto. Number: 2 size: 0.2 and 0.3 cm blocks: 1 William Beck 03/03/2021)    Final Diagnosis performed by Allena Napoleon, MD.   Electronically signed 03/05/2021 Technical component performed at [facility], [place] [address] [place], [place] [address].  Professional component performed at [facility]. [address], [place], [place] [address]  Immunohistochemistry Technical component (if applicable) was performed at [facility]. [address], [place], [place], [place] [address].  IMMUNOHISTOCHEMISTRY DISCLAIMER (if applicable): Some of these immunohistochemical stains may have been developed and the performance characteristics determine by [facility]. Some may not have been cleared or approved by the U.S. Food and Drug Administration. The FDA has determined that such clearance or approval is not necessary. This test is used for clinical purposes. It should not be regarded as investigational or for research. This laboratory is certified under the [place] (CLIA-88) as qualified to perform high complexity clinical laboratory testing.  The controls stained appropriately.   Glucose, capillary     Status: Abnormal   Collection Time: 03/03/21  1:53 PM  Result Value Ref Range   Glucose-Capillary 110 (H) 70 - 99 mg/dL    Comment: Glucose reference range applies only to samples taken after fasting for at least 8 hours.  Comprehensive metabolic panel     Status: Abnormal   Collection Time: 04/07/21  8:16 AM  Result Value Ref Range   Glucose 82 70 - 99 mg/dL   BUN 15 8 - 27 mg/dL    Creatinine, Ser 1.05 0.76 - 1.27 mg/dL   eGFR 77 >59  mL/min/1.73   BUN/Creatinine Ratio 14 10 - 24   Sodium 143 134 - 144 mmol/L   Potassium 3.9 3.5 - 5.2 mmol/L   Chloride 103 96 - 106 mmol/L   CO2 23 20 - 29 mmol/L   Calcium 8.8 8.6 - 10.2 mg/dL   Total Protein 6.4 6.0 - 8.5 g/dL   Albumin 3.7 (L) 3.8 - 4.8 g/dL   Globulin, Total 2.7 1.5 - 4.5 g/dL   Albumin/Globulin Ratio 1.4 1.2 - 2.2   Bilirubin Total 1.1 0.0 - 1.2 mg/dL   Alkaline Phosphatase 278 (H) 44 - 121 IU/L   AST 49 (H) 0 - 40 IU/L   ALT 49 (H) 0 - 44 IU/L  CBC with Differential/Platelet     Status: Abnormal   Collection Time: 04/07/21  8:16 AM  Result Value Ref Range   WBC 3.9 3.4 - 10.8 x10E3/uL   RBC 4.18 4.14 - 5.80 x10E6/uL   Hemoglobin 11.4 (L) 13.0 - 17.7 g/dL   Hematocrit 37.2 (L) 37.5 - 51.0 %   MCV 89 79 - 97 fL   MCH 27.3 26.6 - 33.0 pg   MCHC 30.6 (L) 31.5 - 35.7 g/dL   RDW 19.7 (H) 11.6 - 15.4 %   Platelets 204 150 - 450 x10E3/uL   Neutrophils 63 Not Estab. %   Lymphs 15 Not Estab. %   Monocytes 14 Not Estab. %   Eos 7 Not Estab. %   Basos 1 Not Estab. %   Neutrophils Absolute 2.5 1.4 - 7.0 x10E3/uL   Lymphocytes Absolute 0.6 (L) 0.7 - 3.1 x10E3/uL   Monocytes Absolute 0.6 0.1 - 0.9 x10E3/uL   EOS (ABSOLUTE) 0.3 0.0 - 0.4 x10E3/uL   Basophils Absolute 0.0 0.0 - 0.2 x10E3/uL   Immature Granulocytes 0 Not Estab. %   Immature Grans (Abs) 0.0 0.0 - 0.1 x10E3/uL  Ferritin     Status: None   Collection Time: 04/07/21  8:16 AM  Result Value Ref Range   Ferritin 32 30 - 400 ng/mL  HgB A1c     Status: None   Collection Time: 04/14/21 11:09 AM  Result Value Ref Range   Hemoglobin A1C     HbA1c POC (<> result, manual entry)     HbA1c, POC (prediabetic range)     HbA1c, POC (controlled diabetic range) 6.0 0.0 - 7.0 %    Assessment & Plan:   1) Diabetes mellitus with complication (HCC)- Type unspecified- likely type 1  - Patient has currently controlled asymptomatic diabetes diagnosed since 71  years of age.  William Beck presents with tight glycemic profile.  54% hemorrhage, 25% above range, 12% hypoglycemia.  His point-of-care A1c is 6%.     Patient denies any gross complications, however he remains at a high risk for more acute and chronic complications which include CAD, CVA, CKD, retinopathy, and neuropathy. These are all discussed in detail with the patient.  - Nutritional counseling repeated at each appointment due to patients tendency to fall back in to old habits.  - Suggestion is made for him to avoid simple carbohydrates  from his diet including Cakes, Sweet Desserts, Ice Cream, Soda (diet and regular), Sweet Tea, Candies, Chips, Cookies, Store Bought Juices, Alcohol in Excess of  1-2 drinks a day, Artificial Sweeteners,  Coffee Creamer, and "Sugar-free" Products, Lemonade. This will help patient to have more stable blood glucose profile and potentially avoid unintended weight gain. -He is encouraged to introduce more whole foods, minimally processed carbohydrates to help  him maintain his weight   - I encouraged the patient to consume more plant-based protein, fruits and vegetables.    - Patient is advised to stick to a routine mealtimes to eat 3 meals a day and avoid unnecessary snacks (to snack only to correct hypoglycemia).  - I have approached patient with the following individualized plan to manage diabetes and patient agrees:   -He will continue to require intensive treatment with basal/bolus insulin at a lower dose in order for him to achieve and maintain control of diabetes to target.  He will continue to need mostly one-on-one assistance with meals and insulin administration.   -I advised the family to lower [place] to 24 units nightly, and lower NovoLog to 5 -8  units TID with meals if glucose is above 90 and he is eating (Specific instructions on how to titrate insulin dosage based on glucose readings given to patient in writing).    -He is advised to continue  using his CGM to monitor glucose 4 times per day, before meals and at bedtime and call the clinic if blood glucose is below 70 or greater than 200 for 3 tests in a row.  -His CT scan is negative for any occult intra-abdominal malignancy or neoplasm.  His adrenal function is sufficient.  He has a low ferritin level indicating poor iron stores.   Ferrous sulfate 325 mg p.o. daily at breakfast prescribed last visit for him improve his hemoglobin from 8.2-11.1 g per DL.  Patient reports significant clinical improvement including higher energy.   - Patient is warned not to take insulin without proper monitoring per orders.  - Patient specific target  A1c;  LDL, HDL, Triglycerides,  were discussed in detail.  2) BP/HTN:  His blood pressure is stable at 122/68.   He is on low-dose metoprolol 25 mg p.o. daily.     3) Lipids/HPL:  His recent lipid panel from 12/01/20 shows controlled LDL controlled at 80 .He is advised to continue Lipitor 40 mg po daily at bedtime.  Side effects and precautions discussed with him.  May need to stop this medication due to elevated LFTs in the future.  4)  Weight/Diet:  His Body mass index is 21.72 kg/m.- not a candidate for weight loss.   He still has weight deficit, reports chronic diarrhea.  Colonoscopy was not remarkable.  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  I discussed and initiated Creon 24,000 units 3 times daily with meals.   Continue dietary plan with increased whole food, carbohydrate rich sources are discussed and recommended for him.  5) Chronic Care/Health Maintenance: -Patient is on Statin medications and encouraged to continue to follow up with Ophthalmology, Podiatrist at least yearly or according to recommendations, and advised to   stay away from smoking. I have recommended yearly flu vaccine and pneumonia vaccination at least every 5 years; moderate intensity exercise for up to 150 minutes weekly;  and  sleep for at least 7 hours a day.   - I advised patient to maintain close follow up with Leeanne Rio, MD for primary care needs.     I spent 45 minutes in the care of the patient today including review of labs from [place], Lipids, Thyroid Function, Hematology (current and previous including abstractions from other facilities); face-to-face time discussing  his blood glucose readings/logs, discussing hypoglycemia and hyperglycemia episodes and symptoms, medications doses, his options of short and long term treatment based on the latest  standards of care / guidelines;  discussion about incorporating lifestyle medicine;  and documenting the encounter.    Please refer to Patient Instructions for Blood Glucose Monitoring and Insulin/Medications Dosing Guide"  in media tab for additional information. Please  also refer to " Patient Self Inventory" in the Media  tab for reviewed elements of pertinent patient history.  Heron Nay participated in the discussions, expressed understanding, and voiced agreement with the above plans.  All questions were answered to his satisfaction. he is encouraged to contact clinic should he have any questions or concerns prior to his return visit.    Follow up plan: - Return in about 3 months (around 07/12/2021) for Bring Meter and Logs- A1c in Office.    Rayetta Pigg, [facility] [address] [place], [place] [address] Phone: [phone] Fax: [phone]  04/14/2021, 3:39 PM

## 2021-04-28 ENCOUNTER — Telehealth: Payer: Self-pay | Admitting: "Endocrinology

## 2021-04-28 NOTE — Telephone Encounter (Signed)
Pt wife called and states patient has been on creon for 1 week and half, and that is has not helped with the diarrhea. She is not sure if he should continue taking this or if something else should be tried.  [phone] or [phone]

## 2021-04-28 NOTE — Telephone Encounter (Signed)
Discussed with pt's wife, understanding voiced. 

## 2021-06-01 ENCOUNTER — Ambulatory Visit (INDEPENDENT_AMBULATORY_CARE_PROVIDER_SITE_OTHER): Payer: Medicare Other | Admitting: Gastroenterology

## 2021-06-01 ENCOUNTER — Encounter (INDEPENDENT_AMBULATORY_CARE_PROVIDER_SITE_OTHER): Payer: Self-pay | Admitting: Gastroenterology

## 2021-06-01 ENCOUNTER — Other Ambulatory Visit: Payer: Self-pay

## 2021-06-01 VITALS — BP 129/68 | HR 68 | Temp 97.8°F | Ht 73.0 in | Wt 167.0 lb

## 2021-06-01 DIAGNOSIS — R197 Diarrhea, unspecified: Secondary | ICD-10-CM | POA: Insufficient documentation

## 2021-06-01 DIAGNOSIS — D509 Iron deficiency anemia, unspecified: Secondary | ICD-10-CM | POA: Diagnosis not present

## 2021-06-01 MED ORDER — DICYCLOMINE HCL 10 MG PO CAPS: 10.0000 mg | ORAL_CAPSULE | Freq: Three times a day (TID) | ORAL | 1 refills | Status: DC

## 2021-06-01 NOTE — Progress Notes (Signed)
? ?Referring Provider: Leeanne Rio, MD ?Primary Care Physician:  Leeanne Rio, MD ?Primary GI Physician: castaneda ? ?Chief Complaint  ?Patient presents with  ? Diarrhea  ?  3 month follow up on diarrhea and iron def anemia. Has about 3 epidsodes of diarrhea per day.   ? ?HPI:   ?William Beck is a 71 y.o. male with past medical history of DM, HLD, HTN, Myasthenia gravis,  ? ?Patient presenting today for 3 month follow up of diarrhea and IDA.  ? ?Patient last seen 02/19/21 as a new patient for diarrhea and melena, x1 month. Started on Iron supplements and advised to hold ASA and Plavix by PCP, Hgb 8.6 at that time. Scheduled for EGD and colonoscopy, started on PPI daily, advised to stop all NSAIDs. EGD and colonoscopy done at the end of December, patient advised to start benefiber daily. ? ?Labs on 04/07/21 with hgb 11.4, MCV 89, ferritin 32. ? ?He states that he continues to have diarrhea 3x/day since November, Dr. Dorris Fetch gave him creon 24000 units before meals which did not help any, also reports that this was $700 for 3 month supply. States that stools are somewhat watery in nature. Has used imodium which has not helped. Reports he also tried benefiber and pepto bismol which did not help. Diarrhea tends to be unrelated to eating. Diet is very wholesome consisting of lean meats, veggies and protein, some starches at times but usually follows diabetic diet pretty strictly. Wife states that he sometimes gets up in the middle of the night and usually first thing in the morning to have a BM. No fecal incontinence but does have urgency at times. States that he never has a solid stool anymore since this began. He even tried stopping his supplemental Iron to see if this helped, though it did not, notably did not have melena while off of his iron supplements. He has not had any different medications started other than this. Denies any abdominal pain, nausea or vomiting. Denies any weight loss since onset of  diarrhea. Continues on pantoprazole once daily. States that appetite is good. Denies any BRBPR. No dysphagia, odynophagia, nausea, vomiting. No travel or recent sick contacts. No SOB, dizziness or fatigue.  ? ?Last Colonoscopy:03/03/21- Hemorrhoids found on perianal exam.- One 8 mm polyp in the ascending colon ?- Tortuous colon. Biopsied. ?- Non-bleeding external internal hemorrhoids. ?(1 tubular adenoma, normal biopsy of colon) ?Last Endoscopy:03/03/21- 1 cm hiatal hernia. ?- Idiopathic deformity in the gastric body. ?- Erythematous mucosa in the antrum. Biopsied-normal ?- Normal examined duodenum. Biopsied-normal ? ?Recommendations:  ?Repeat colonoscopy dec 2029 ? ?Past Medical History:  ?Diagnosis Date  ? Diabetes mellitus type I ([place])   ? Hyperlipidemia   ? Hypertension   ? Myasthenia gravis ([place])   ? Stroke [facility])   ? ?Past Surgical History:  ?Procedure Laterality Date  ? BACK SURGERY    ? lumbar  ? BIOPSY  03/03/2021  ? Procedure: BIOPSY;  Surgeon: Montez Morita, Quillian Quince, MD;  [facility];  Service: Gastroenterology;;  ? COLONOSCOPY WITH PROPOFOL N/A 03/03/2021  ? Procedure: COLONOSCOPY WITH PROPOFOL;  Surgeon: Harvel Quale, MD;  [facility];  Service: Gastroenterology;  Laterality: N/A;  1:30  ? ESOPHAGOGASTRODUODENOSCOPY (EGD) WITH PROPOFOL N/A 03/03/2021  ? Procedure: ESOPHAGOGASTRODUODENOSCOPY (EGD) WITH PROPOFOL;  Surgeon: Harvel Quale, MD;  [facility];  Service: Gastroenterology;  Laterality: N/A;  ? POLYPECTOMY  03/03/2021  ? Procedure: POLYPECTOMY;  Surgeon: Harvel Quale,  MD;  [facility];  Service: Gastroenterology;;  ? ? ?Current Outpatient Medications  ?Medication Sig Dispense Refill  ? atorvastatin (LIPITOR) 40 MG tablet Take 40 mg by mouth daily.    ? Continuous Blood Gluc Sensor (FREESTYLE LIBRE 14 DAY SENSOR) MISC Inject 1 each into the skin every 14 (fourteen) days. Use as directed. 2 each 2  ? ferrous  sulfate (FERROUSUL) 325 (65 FE) MG tablet Take 1 tablet (325 mg total) by mouth daily with breakfast. 90 tablet 1  ? insulin aspart (NOVOLOG FLEXPEN) 100 UNIT/ML FlexPen Inject 5-8 Units into the skin 3 (three) times daily with meals. 15 mL 1  ? insulin degludec (TRESIBA) 100 UNIT/ML FlexTouch Pen Inject 24 Units into the skin at bedtime.    ? Insulin Pen Needle (B-D ULTRAFINE III SHORT PEN) 31G X 8 MM MISC 1 each by Does not apply route as directed. 100 each 3  ? metoprolol tartrate (LOPRESSOR) 25 MG tablet Take 25 mg by mouth 2 (two) times daily.    ? mirtazapine (REMERON) 15 MG tablet Take 15 mg by mouth at bedtime.    ? montelukast (SINGULAIR) 10 MG tablet Take 10 mg by mouth at bedtime.    ? Pancrelipase, Lip-Prot-Amyl, (CREON) 24000-76000 units CPEP Take 1 capsule (24,000 Units total) by mouth 3 (three) times daily with meals. 270 capsule 1  ? pantoprazole (PROTONIX) 40 MG tablet Take 1 tablet (40 mg total) by mouth daily. 30 tablet 3  ? predniSONE (DELTASONE) 10 MG tablet Take 10 mg by mouth daily with breakfast.    ? pyridostigmine (MESTINON) 60 MG tablet Take 30 mg by mouth 3 (three) times daily.    ? aspirin EC 81 MG tablet Take 81 mg by mouth daily. Swallow whole. (Patient not taking: Reported on 04/14/2021)    ? clopidogrel (PLAVIX) 75 MG tablet Take 75 mg by mouth daily. (Patient not taking: Reported on 04/14/2021)    ? loperamide (IMODIUM A-D) 2 MG tablet Take 2-4 mg by mouth 4 (four) times daily as needed for diarrhea or loose stools. (Patient not taking: Reported on 04/14/2021)    ? Multiple Vitamin (MULTIVITAMIN) capsule Take 1 capsule by mouth daily. (Patient not taking: Reported on 06/01/2021)    ? Probiotic Product (PROBIOTIC PO) Take 1 capsule by mouth daily. (Patient not taking: Reported on 06/01/2021)    ? ?No current facility-administered medications for this visit.  ? ?Allergies as of 06/01/2021  ? (No Known Allergies)  ? ?Family History  ?Problem Relation Age of Onset  ? Heart attack Mother   ?  Stroke Mother   ? Diabetes Father   ? Heart attack Brother   ? ?Social History  ? ?Socioeconomic History  ? Marital status: Married  ?  Spouse name: Not on file  ? Number of children: Not on file  ? Years of education: Not on file  ? Highest education level: Not on file  ?Occupational History  ? Not on file  ?Tobacco Use  ? Smoking status: Never  ?  Passive exposure: Never  ? Smokeless tobacco: Never  ?Vaping Use  ? Vaping Use: Never used  ?Substance and Sexual Activity  ? Alcohol use: No  ? Drug use: No  ? Sexual activity: Not on file  ?Other Topics Concern  ? Not on file  ?Social History Narrative  ? Not on file  ? ?Social Determinants of Health  ? ?Financial Resource Strain: Not on file  ?Food Insecurity: Not on file  ?Transportation  Needs: Not on file  ?Physical Activity: Not on file  ?Stress: Not on file  ?Social Connections: Not on file  ? ?Review of systems ?General: negative for malaise, night sweats, fever, chills, weight loss ?Neck: Negative for lumps, goiter, pain and significant neck swelling ?Resp: Negative for cough, wheezing, dyspnea at rest ?CV: Negative for chest pain, leg swelling, palpitations, orthopnea ?GI: denies melena, hematochezia, nausea, vomiting, constipation, dysphagia, odyonophagia, early satiety or unintentional weight loss. +diarrhea ?MSK: Negative for joint pain or swelling, back pain, and muscle pain. ?Derm: Negative for itching or rash ?Psych: Denies depression, anxiety, memory loss, confusion. No homicidal or suicidal ideation.  ?Heme: Negative for prolonged bleeding, bruising easily, and swollen nodes. ?Endocrine: Negative for cold or heat intolerance, polyuria, polydipsia and goiter. ?Neuro: negative for tremor, gait imbalance, syncope and seizures. ?The remainder of the review of systems is noncontributory. ? ?Physical Exam: ?BP 129/68 (BP Location: Right Arm, Patient Position: Sitting, Cuff Size: Large)   Pulse 68   Temp 97.8 ?F (36.6 ?C) (Oral)   Ht '6\' 1"'$  (1.854 m)   Wt  167 lb (75.8 kg)   BMI 22.03 kg/m?  ?General:   Alert and oriented. No distress noted. Pleasant and cooperative.  ?Head:  Normocephalic and atraumatic. ?Eyes:  Conjuctiva clear without scleral icterus. ?Mouth:

## 2021-06-01 NOTE — Patient Instructions (Signed)
We will check iron studies today as well as stool studies and some other blood tests to help determine the cause of your diarrhea. ?I am sending dicyclomine '10mg'$  for you to take before meals to see if this helps to slow down the diarrhea ?I would also recommend trying a probiotic daily, does not matter the brand, but I prefer the strain lactobacillus rhamnosus as it is more widely studied than others. ? ?I will be in touch regarding results of these tests, if they are without findings, will consider increasing the dosage of the creon as you are on a very low dose, I can provide samples of this since it was so expensive until we know if it is going to work for you ? ?Follow up 6 months ?

## 2021-06-02 LAB — CELIAC DISEASE PANEL
(tTG) Ab, IgA: <1 U/mL
(tTG) Ab, IgG: <1 U/mL
Gliadin IgA: 3.7 U/mL
Gliadin IgG: <1 U/mL
Immunoglobulin A: 264 mg/dL (ref 70–320)

## 2021-06-02 LAB — IRON,TIBC AND FERRITIN PANEL
%SAT: 34 % (calc) (ref 20–48)
Ferritin: 16 ng/mL — ABNORMAL LOW (ref 24–380)
Iron: 99 ug/dL (ref 50–180)
TIBC: 295 mcg/dL (calc) (ref 250–425)

## 2021-06-08 ENCOUNTER — Other Ambulatory Visit (INDEPENDENT_AMBULATORY_CARE_PROVIDER_SITE_OTHER): Payer: Self-pay | Admitting: Gastroenterology

## 2021-06-08 LAB — GASTROINTESTINAL PATHOGEN PNL
CampyloBacter Group: NOT DETECTED
Norovirus GI/GII: NOT DETECTED
Rotavirus A: NOT DETECTED
Salmonella species: NOT DETECTED
Shiga Toxin 1: NOT DETECTED
Shiga Toxin 2: NOT DETECTED
Shigella Species: NOT DETECTED
Vibrio Group: NOT DETECTED
Yersinia enterocolitica: DETECTED — AB

## 2021-06-08 LAB — OVA AND PARASITE EXAMINATION
CONCENTRATE RESULT:: NONE SEEN
MICRO NUMBER:: 13197109
SPECIMEN QUALITY:: ADEQUATE
TRICHROME RESULT:: NONE SEEN

## 2021-06-08 LAB — ISOLATE REFERRAL PH
MICRO NUMBER:: 13212134
SPECIMEN QUALITY:: ADEQUATE

## 2021-06-08 MED ORDER — CIPROFLOXACIN HCL 500 MG PO TABS: 500.0000 mg | ORAL_TABLET | Freq: Two times a day (BID) | ORAL | 0 refills | Status: AC

## 2021-06-25 ENCOUNTER — Other Ambulatory Visit (INDEPENDENT_AMBULATORY_CARE_PROVIDER_SITE_OTHER): Payer: Self-pay | Admitting: Gastroenterology

## 2021-06-25 NOTE — Telephone Encounter (Signed)
Last seen 06/01/21

## 2021-07-03 ENCOUNTER — Other Ambulatory Visit: Payer: Self-pay | Admitting: "Endocrinology

## 2021-07-04 ENCOUNTER — Other Ambulatory Visit: Payer: Self-pay | Admitting: "Endocrinology

## 2021-07-15 ENCOUNTER — Encounter: Payer: Self-pay | Admitting: "Endocrinology

## 2021-07-15 ENCOUNTER — Ambulatory Visit (INDEPENDENT_AMBULATORY_CARE_PROVIDER_SITE_OTHER): Payer: Medicare Other | Admitting: "Endocrinology

## 2021-07-15 VITALS — BP 104/52 | HR 68 | Ht 73.0 in | Wt 163.0 lb

## 2021-07-15 DIAGNOSIS — E782 Mixed hyperlipidemia: Secondary | ICD-10-CM | POA: Diagnosis not present

## 2021-07-15 DIAGNOSIS — I10 Essential (primary) hypertension: Secondary | ICD-10-CM | POA: Diagnosis not present

## 2021-07-15 DIAGNOSIS — E109 Type 1 diabetes mellitus without complications: Secondary | ICD-10-CM | POA: Diagnosis not present

## 2021-07-15 LAB — POCT GLYCOSYLATED HEMOGLOBIN (HGB A1C): HbA1c, POC (controlled diabetic range): 8.4 % — AB (ref 0.0–7.0)

## 2021-07-15 MED ORDER — CREON 24000-76000 UNITS PO CPEP: 1.0000 | ORAL_CAPSULE | Freq: Three times a day (TID) | ORAL | 1 refills | Status: AC

## 2021-07-15 MED ORDER — FERROUS SULFATE 325 (65 FE) MG PO TABS: 325.0000 mg | ORAL_TABLET | Freq: Two times a day (BID) | ORAL | 1 refills | Status: AC

## 2021-07-15 NOTE — Progress Notes (Signed)
07/15/2021 ? ?Endocrinology Follow-up Note ? ? ?Subjective:  ? ? Patient ID: William Beck, male    DOB: 23-Feb-1951.  He is being seen in follow-up for the management of his type 1 diabetes, hyperlipidemia, hypertension.   ? ?PMD:  William Rio, MD ? ?Past Medical History:  ?Diagnosis Date  ? Diabetes mellitus type I ([place])   ? Hyperlipidemia   ? Hypertension   ? Myasthenia gravis ([place])   ? Stroke [facility])   ? ?Past Surgical History:  ?Procedure Laterality Date  ? BACK SURGERY    ? lumbar  ? BIOPSY  03/03/2021  ? Procedure: BIOPSY;  Surgeon: Montez Morita, Quillian Quince, MD;  [facility];  Service: Gastroenterology;;  ? COLONOSCOPY WITH PROPOFOL N/A 03/03/2021  ? Procedure: COLONOSCOPY WITH PROPOFOL;  Surgeon: Harvel Quale, MD;  [facility];  Service: Gastroenterology;  Laterality: N/A;  1:30  ? ESOPHAGOGASTRODUODENOSCOPY (EGD) WITH PROPOFOL N/A 03/03/2021  ? Procedure: ESOPHAGOGASTRODUODENOSCOPY (EGD) WITH PROPOFOL;  Surgeon: Harvel Quale, MD;  [facility];  Service: Gastroenterology;  Laterality: N/A;  ? POLYPECTOMY  03/03/2021  ? Procedure: POLYPECTOMY;  Surgeon: Harvel Quale, MD;  [facility];  Service: Gastroenterology;;  ? ?Social History  ? ?Socioeconomic History  ? Marital status: Married  ?  Spouse name: Not on file  ? Number of children: Not on file  ? Years of education: Not on file  ? Highest education level: Not on file  ?Occupational History  ? Not on file  ?Tobacco Use  ? Smoking status: Never  ?  Passive exposure: Never  ? Smokeless tobacco: Never  ?Vaping Use  ? Vaping Use: Never used  ?Substance and Sexual Activity  ? Alcohol use: No  ? Drug use: No  ? Sexual activity: Not on file  ?Other Topics Concern  ? Not on file  ?Social History Narrative  ? Not on file  ? ?Social Determinants of Health  ? ?Financial Resource Strain: Not on file  ?Food Insecurity: Not on file  ?Transportation Needs: Not on file  ?Physical  Activity: Not on file  ?Stress: Not on file  ?Social Connections: Not on file  ? ?Outpatient Encounter Medications as of 07/15/2021  ?Medication Sig  ? Multiple Vitamin (MULTIVITAMIN) capsule Take 1 capsule by mouth daily.  ? Probiotic Product (PROBIOTIC PO) Take 1 capsule by mouth daily.  ? atorvastatin (LIPITOR) 40 MG tablet Take 40 mg by mouth daily.  ? Continuous Blood Gluc Sensor (FREESTYLE LIBRE 14 DAY SENSOR) MISC Inject 1 each into the skin every 14 (fourteen) days. Use as directed.  ? dicyclomine (BENTYL) 10 MG capsule TAKE 1 CAPSULE BY MOUTH 3 TIMES DAILY BEFORE MEALS  ? ferrous sulfate 325 (65 FE) MG tablet Take 1 tablet (325 mg total) by mouth 2 (two) times daily with a meal.  ? insulin aspart (NOVOLOG FLEXPEN) 100 UNIT/ML FlexPen Inject 5-8 Units into the skin 3 (three) times daily with meals.  ? insulin degludec (TRESIBA) 100 UNIT/ML FlexTouch Pen Inject 20 Units into the skin at bedtime.  ? Insulin Pen Needle (B-D ULTRAFINE III SHORT PEN) 31G X 8 MM MISC 1 each by Does not apply route as directed.  ? metoprolol tartrate (LOPRESSOR) 25 MG tablet Take 25 mg by mouth 2 (two) times daily.  ? mirtazapine (REMERON) 15 MG tablet Take 15 mg by mouth at bedtime.  ? montelukast (SINGULAIR) 10 MG tablet Take 10 mg by mouth at bedtime.  ? Pancrelipase, Lip-Prot-Amyl, (  CREON) 24000-76000 units CPEP Take 1 capsule (24,000 Units total) by mouth 3 (three) times daily with meals.  ? pantoprazole (PROTONIX) 40 MG tablet TAKE 1 TABLET BY MOUTH EVERY DAY  ? predniSONE (DELTASONE) 10 MG tablet Take 10 mg by mouth daily with breakfast.  ? pyridostigmine (MESTINON) 60 MG tablet Take 30 mg by mouth 3 (three) times daily.  ? [DISCONTINUED] aspirin EC 81 MG tablet Take 81 mg by mouth daily. Swallow whole. (Patient not taking: Reported on 04/14/2021)  ? [DISCONTINUED] clopidogrel (PLAVIX) 75 MG tablet Take 75 mg by mouth daily. (Patient not taking: Reported on 04/14/2021)  ? [DISCONTINUED] ferrous sulfate 325 (65 FE) MG tablet TAKE  1 TABLET BY MOUTH EVERY DAY WITH BREAKFAST  ? [DISCONTINUED] loperamide (IMODIUM A-D) 2 MG tablet Take 2-4 mg by mouth 4 (four) times daily as needed for diarrhea or loose stools. (Patient not taking: Reported on 04/14/2021)  ? [DISCONTINUED] Pancrelipase, Lip-Prot-Amyl, (CREON) 24000-76000 units CPEP Take 1 capsule (24,000 Units total) by mouth 3 (three) times daily with meals. (Patient not taking: Reported on 07/15/2021)  ? ?No facility-administered encounter medications on file as of 07/15/2021.  ? ?ALLERGIES: ?No Known Allergies ?VACCINATION STATUS: ?Immunization History  ?Administered Date(s) Administered  ? Moderna Sars-Covid-2 Vaccination 02/11/2020  ? ? ? ?Diabetes ?He presents for his follow-up diabetic visit. He has type 1 diabetes mellitus. Onset time: He was diagnosed at approximate age of 36 years. His disease course has been worsening. Pertinent negatives for hypoglycemia include no confusion, headaches, hunger, nervousness/anxiousness, pallor, seizures, sweats or tremors. Pertinent negatives for diabetes include no chest pain, no fatigue, no polydipsia, no polyphagia, no polyuria, no weakness and no weight loss. There are no hypoglycemic complications. (He has no precipitating symptoms, his wife notices it) Symptoms are worsening. There are no diabetic complications. Risk factors for coronary artery disease include diabetes mellitus, dyslipidemia, hypertension, male sex and sedentary lifestyle. Current diabetic treatment includes intensive insulin program. He is compliant with treatment most of the time. His weight is fluctuating minimally. When asked about meal planning, he reported none. He has not had a previous visit with a dietitian. He participates in exercise intermittently. His home blood glucose trend is increasing steadily. His breakfast blood glucose range is generally 180-200 mg/dl. His lunch blood glucose range is generally 140-180 mg/dl. His dinner blood glucose range is generally 140-180  mg/dl. His bedtime blood glucose range is generally 180-200 mg/dl. His overall blood glucose range is 180-200 mg/dl. (Mr. Catino presents with higher than target glycemic profile, 50% time range, 23% level 1 hyperglycemia, 12 % level 2 hyperglycemia, 10% level 1 hypoglycemia, 5% level 2 hypoglycemia.  ?His point-of-care A1c is higher at 8.4%.  However, patient feels better, having much less frequent hypoglycemia than before.) An ACE inhibitor/angiotensin II receptor blocker is being taken. He sees a podiatrist.Eye exam is current.  ?Hyperlipidemia ?This is a chronic problem. The current episode started more than 1 year ago. The problem is controlled. Recent lipid tests were reviewed and are normal. Exacerbating diseases include diabetes. He has no history of chronic renal disease. Factors aggravating his hyperlipidemia include fatty foods. Pertinent negatives include no chest pain, myalgias or shortness of breath. Current antihyperlipidemic treatment includes statins. The current treatment provides moderate improvement of lipids. Compliance problems include adherence to diet and adherence to exercise.  Risk factors for coronary artery disease include diabetes mellitus, dyslipidemia, hypertension, a sedentary lifestyle and male sex.  ?Hypertension ?This is a chronic problem. The current episode started more than  1 year ago. The problem has been gradually improving since onset. The problem is controlled. Pertinent negatives include no chest pain, headaches, neck pain, palpitations, shortness of breath or sweats. There are no associated agents to hypertension. Risk factors for coronary artery disease include dyslipidemia, diabetes mellitus and male gender. Past treatments include beta blockers. The current treatment provides moderate improvement. There are no compliance problems.  There is no history of chronic renal disease.  ? ?Review of systems ? ?Constitutional: +steadily decreasing body weight, current Body mass  index is 21.51 kg/m?. , no fatigue, no subjective hyperthermia, no subjective hypothermia ? ? ? ?Objective:  ?  ?BP (!) 104/52   Pulse 68   Ht '6\' 1"'$  (1.854 m)   Wt 163 lb (73.9 kg)   BMI 21.51 kg/m?   ?W

## 2021-07-20 ENCOUNTER — Other Ambulatory Visit (INDEPENDENT_AMBULATORY_CARE_PROVIDER_SITE_OTHER): Payer: Self-pay | Admitting: Gastroenterology

## 2021-08-02 ENCOUNTER — Other Ambulatory Visit (INDEPENDENT_AMBULATORY_CARE_PROVIDER_SITE_OTHER): Payer: Self-pay | Admitting: Gastroenterology

## 2021-08-06 DEATH — deceased

## 2021-10-22 ENCOUNTER — Ambulatory Visit: Payer: Medicare Other | Admitting: "Endocrinology

## 2021-11-23 ENCOUNTER — Ambulatory Visit (INDEPENDENT_AMBULATORY_CARE_PROVIDER_SITE_OTHER): Payer: Medicare Other | Admitting: Gastroenterology

## 2022-01-28 IMAGING — CT CT ABD-PELV W/ CM
2 of 5 series · 16 of 46 positions shown, 18 images · IV contrast (Omnipaque or Isovue)
Comparison: None.

CLINICAL DATA: Unintentional weight loss over 2 years, history of
diabetes

EXAM:
CT ABDOMEN AND PELVIS WITH CONTRAST
TECHNIQUE: Multidetector CT imaging of the abdomen and pelvis was performed
using the standard protocol following bolus administration of
intravenous contrast.
CONTRAST:  100mL OMNIPAQUE IOHEXOL 300 MG/ML  SOLN

[Series 2: axial st · axial · 0.81mm/px · z∈[+165,+605]mm · 13 of 100 slices shown, 15 images]
[im 6/100  soft-tissue]
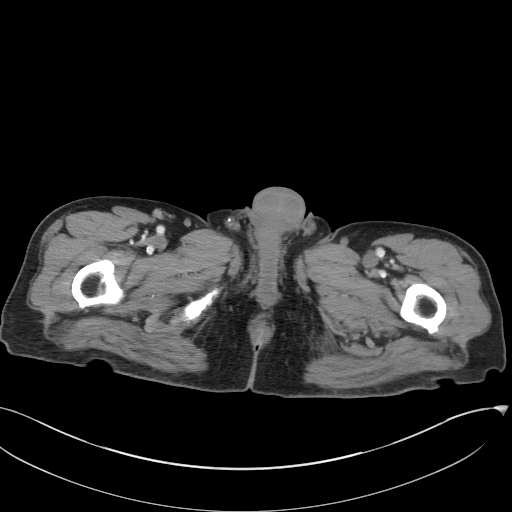
[im 6/100  bone]
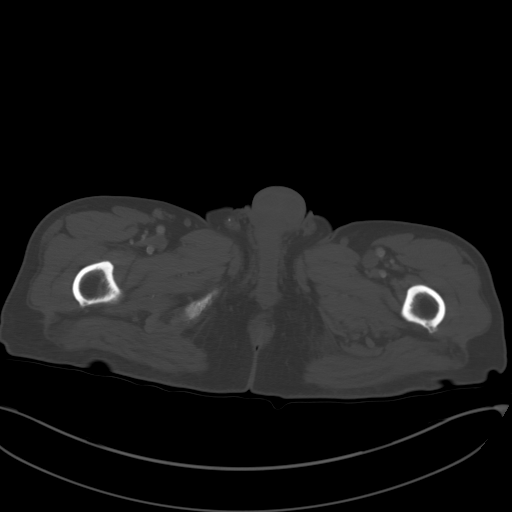
[im 16/100  soft-tissue]
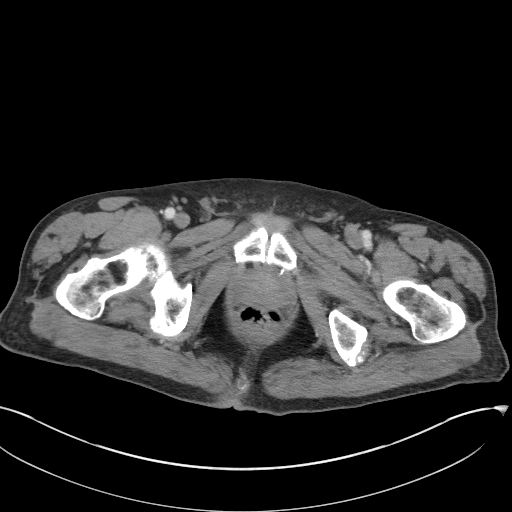
[im 21/100  soft-tissue]
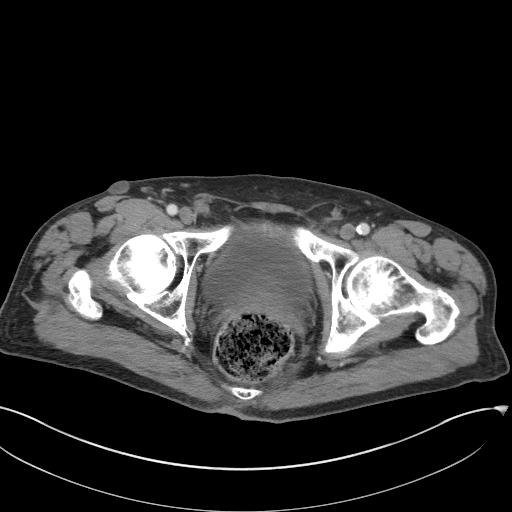
[im 27/100  soft-tissue]
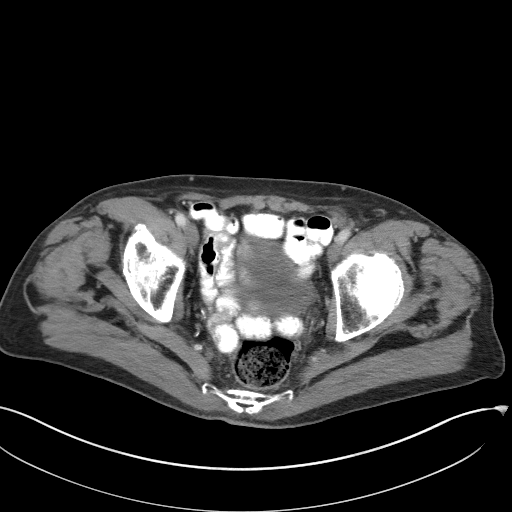
[im 37/100  soft-tissue]
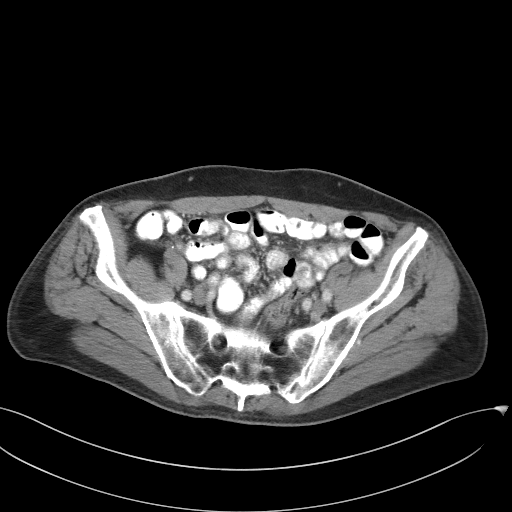
[im 42/100  soft-tissue]
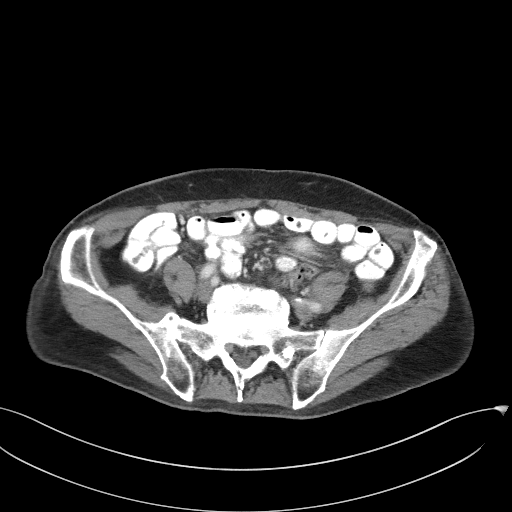
[im 53/100  soft-tissue]
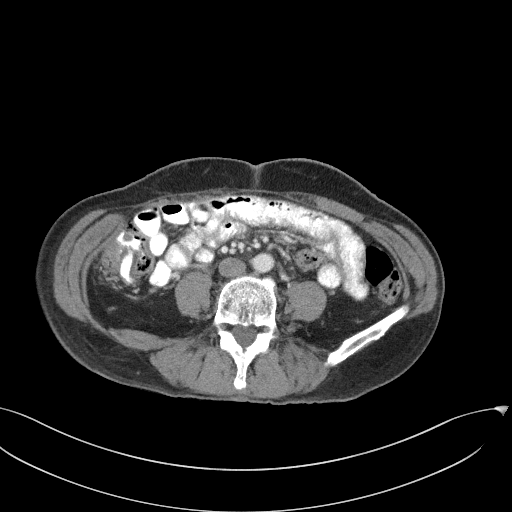
[im 58/100  soft-tissue]
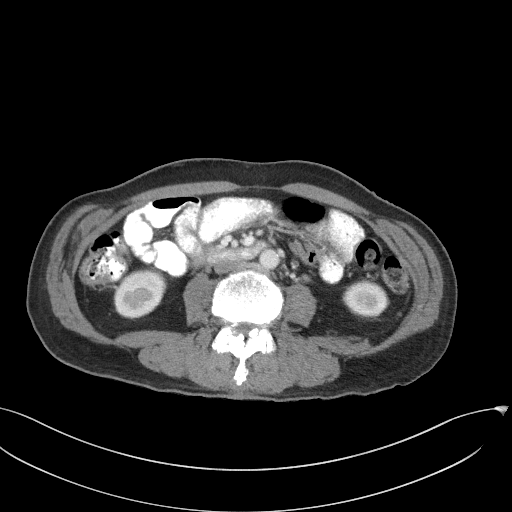
[im 63/100  soft-tissue]
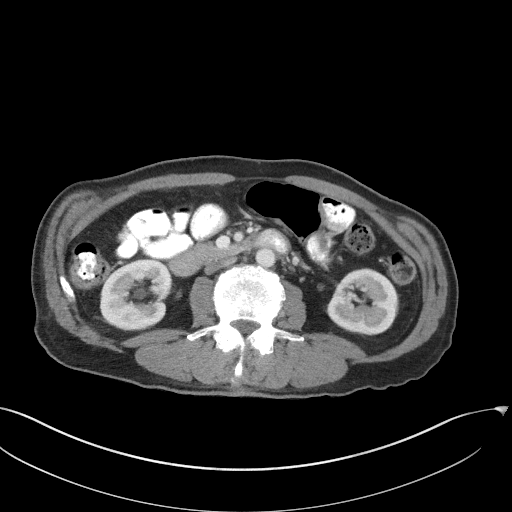
[im 63/100  bone]
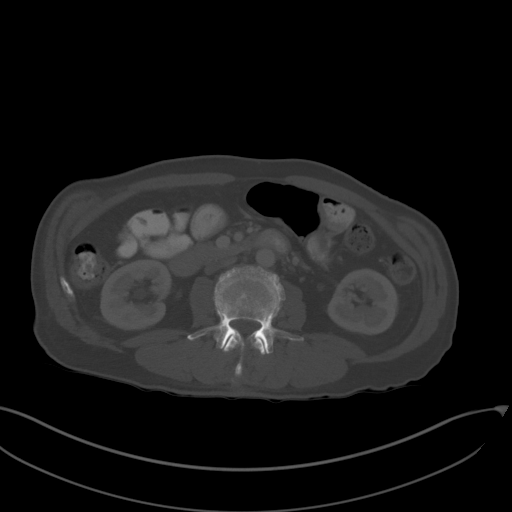
[im 73/100  soft-tissue]
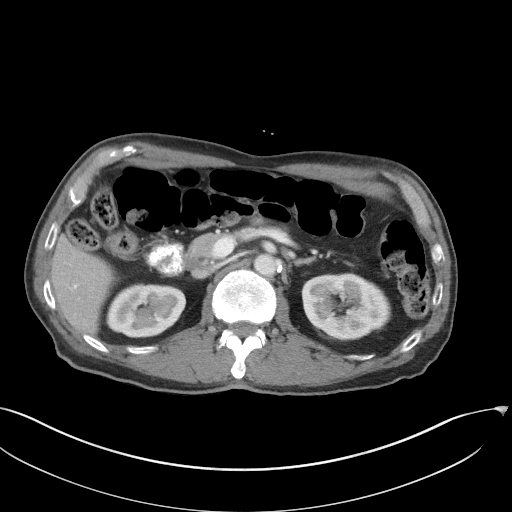
[im 79/100  soft-tissue]
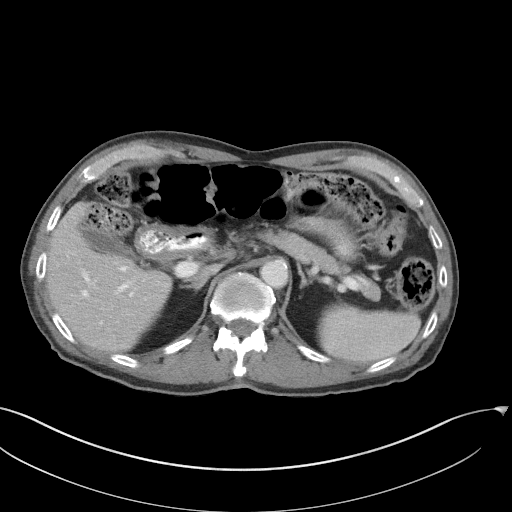
[im 84/100  soft-tissue]
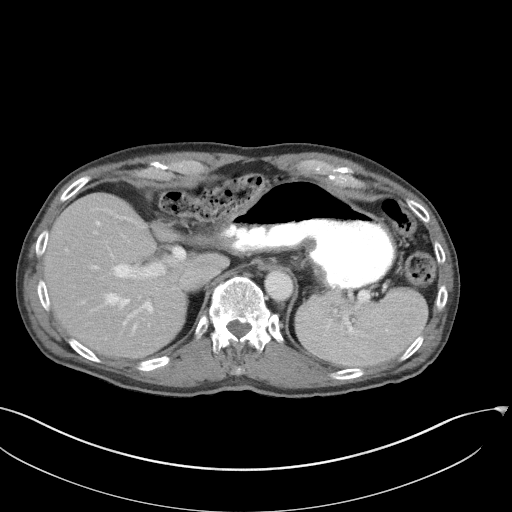
[im 94/100  soft-tissue]
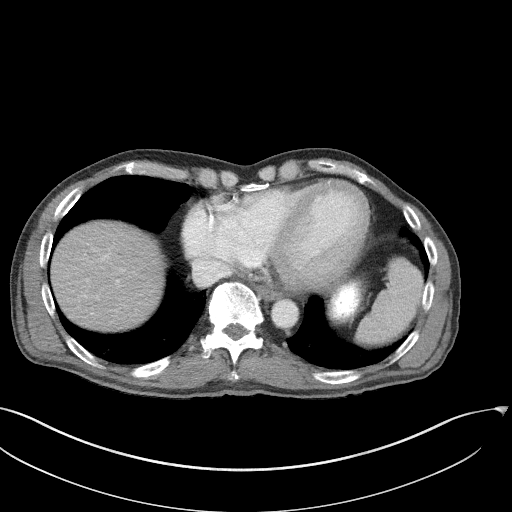

[Series 5: coronal st · coronal · 0.79mm/px · 3 of 93 slices shown]
[im 31/93  soft-tissue]
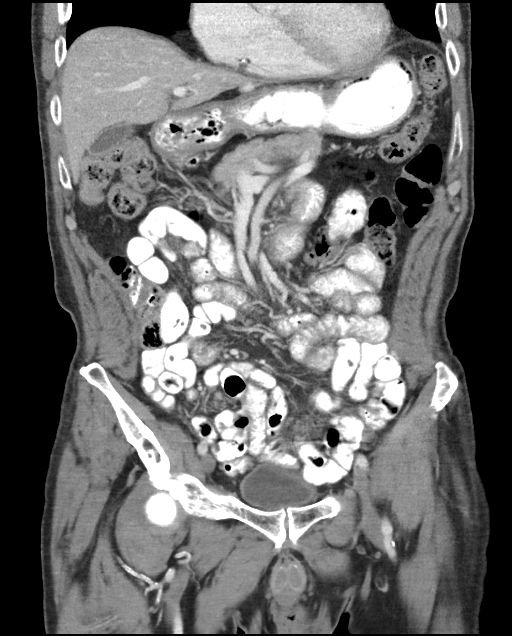
[im 41/93  soft-tissue]
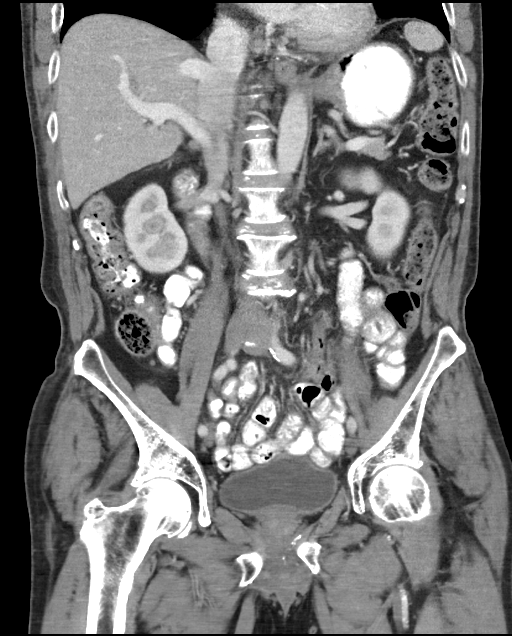
[im 52/93  soft-tissue]
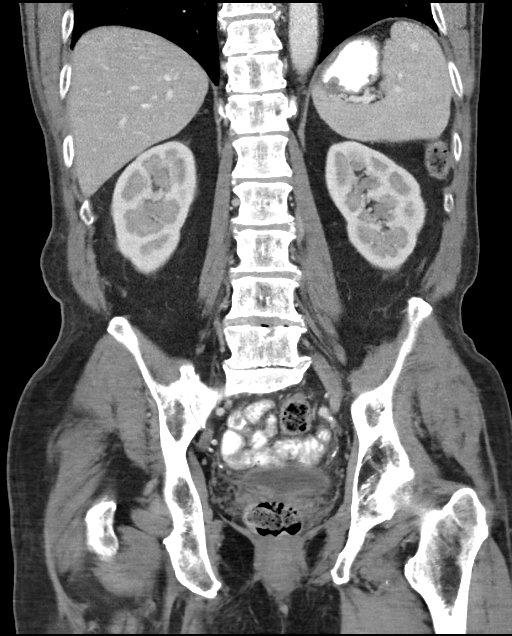

[16 of 46 positions shown; findings below may reference images not displayed]

FINDINGS: Lower chest: Dependent bibasilar atelectasis versus scarring. Mild
pectus deformity of the anterior chest. Normal heart size. No
pericardial or pleural effusion. Native coronary atherosclerosis.
Degenerative changes of the lower thoracic spine.

Hepatobiliary: No focal hepatic abnormality. Small left hepatic lobe
noted. No biliary obstruction pattern. Gallbladder nondistended.
Common bile duct nondilated.

Pancreas: Unremarkable. No pancreatic ductal dilatation or
surrounding inflammatory changes.

Spleen: Normal in size without focal abnormality.

Adrenals/Urinary Tract: Adrenal glands are unremarkable. Kidneys are
normal, without renal calculi, focal lesion, or hydronephrosis.
Bladder is unremarkable.

Stomach/Bowel: Negative for bowel obstruction, significant
dilatation, ileus, or free air. Normal appendix demonstrated. Slight
wall prominence of the distal stomach and duodenum, nonspecific and
may be related to under distension. Similar under distended
appearance of the cecum and right colon.

No free fluid, fluid collection, hemorrhage, hematoma, abscess or
ascites.

Vascular/Lymphatic: Aorta atherosclerotic. Negative for aneurysm,
dissection, or acute vascular process. Mesenteric and renal
vasculature appear patent with calcified origins. No adenopathy.

Reproductive: Mild prostate enlargement. No other significant
finding by CT.

Other: No abdominal wall hernia or abnormality. No abdominopelvic
ascites.

Musculoskeletal: Degenerative changes noted of the spine. These
changes are most pronounced at L4-5 and L5-S1. No acute osseous
finding.

Right inguinal area subcutaneous anterior cystic skin lesion
measures 15 mm, image 80/2 favored to be a sebaceous cyst.
IMPRESSION: No acute intra-abdominal or pelvic finding by CT.

Aortic Atherosclerosis ([id number]-NI6.6).
